# Patient Record
Sex: Male | Born: 1985
Health system: Southern US, Community
[De-identification: ages and names within clinical notes are randomized; demographics above are authoritative.]

## PROBLEM LIST (undated history)

## (undated) DIAGNOSIS — K859 Acute pancreatitis without necrosis or infection, unspecified: Secondary | ICD-10-CM

---

## 2016-01-10 ENCOUNTER — Emergency Department (HOSPITAL_BASED_OUTPATIENT_CLINIC_OR_DEPARTMENT_OTHER)
Admission: EM | Admit: 2016-01-10 | Discharge: 2016-01-10 | Disposition: A | Discharge status: Home / Self Care | Payer: Self-pay | Attending: Emergency Medicine | Admitting: Emergency Medicine

## 2016-01-10 ENCOUNTER — Encounter (HOSPITAL_BASED_OUTPATIENT_CLINIC_OR_DEPARTMENT_OTHER): Payer: Self-pay | Admitting: Emergency Medicine

## 2016-01-10 ENCOUNTER — Emergency Department (HOSPITAL_BASED_OUTPATIENT_CLINIC_OR_DEPARTMENT_OTHER): Payer: Self-pay

## 2016-01-10 DIAGNOSIS — R531 Weakness: Secondary | ICD-10-CM | POA: Insufficient documentation

## 2016-01-10 DIAGNOSIS — F17210 Nicotine dependence, cigarettes, uncomplicated: Secondary | ICD-10-CM | POA: Insufficient documentation

## 2016-01-10 DIAGNOSIS — R0602 Shortness of breath: Secondary | ICD-10-CM | POA: Insufficient documentation

## 2016-01-10 DIAGNOSIS — R0789 Other chest pain: Secondary | ICD-10-CM | POA: Insufficient documentation

## 2016-01-10 DIAGNOSIS — R05 Cough: Secondary | ICD-10-CM | POA: Insufficient documentation

## 2016-01-10 NOTE — ED Provider Notes (Signed)
[facility] Provider Note   CSN: [id number] Arrival date & time: 01/10/16  1650 By signing my name below, I, Bridgette HabermannMaria Tan, attest that this documentation has been prepared under the direction and in the presence of Melene Planan Keamber Macfadden, DO. Electronically Signed: Bridgette HabermannMaria Tan, ED Scribe. 01/10/16. 6:04 PM.  History   Chief Complaint Chief Complaint  Patient presents with  . Chest Pain   HPI Comments: Jeffrey Monroe is a 30 y.o. male who presents to the Emergency Department complaining of constant, aching, 7/10 chest pain onset one day ago. Pt also has associated generalized weakness, mild shortness of breath, cough, and dizziness. Pain is exacerbated with inspiration. No alleviating factors noted. Pt denies h/o recent long travel. Denies recent surgeries or h/o blood clots. Pt denies vomiting, hemoptysis, or any other associated symptoms.  The history is provided by the patient. No language interpreter was used.  Chest Pain   This is a new problem. The current episode started yesterday. The problem occurs constantly. The problem has not changed since onset.The pain is associated with breathing. The pain is present in the substernal region. The pain is at a severity of 7/10. The pain is moderate. The pain does not radiate. Duration of episode(s) is 2 days. The symptoms are aggravated by certain positions and deep breathing. Associated symptoms include cough, dizziness, shortness of breath and weakness. Pertinent negatives include no abdominal pain, no fever, no headaches, no nausea, no palpitations and no vomiting. He has tried nothing for the symptoms. Risk factors include smoking/tobacco exposure.   History reviewed. No pertinent past medical history.  There are no active problems to display for this patient.  History reviewed. No pertinent surgical history.   Home Medications    Prior to Admission medications   Not on File    Family History History reviewed. No pertinent family  history.  Social History Social History  Substance Use Topics  . Smoking status: Current Every Day Smoker    Packs/day: 1.00    Types: Cigarettes  . Smokeless tobacco: Never Used  . Alcohol use No     Allergies   Review of patient's allergies indicates no known allergies.   Review of Systems Review of Systems  Constitutional: Negative for chills and fever.  HENT: Negative for congestion and facial swelling.   Eyes: Negative for discharge and visual disturbance.  Respiratory: Positive for cough and shortness of breath.   Cardiovascular: Positive for chest pain. Negative for palpitations.  Gastrointestinal: Negative for abdominal pain, diarrhea, nausea and vomiting.  Musculoskeletal: Negative for arthralgias and myalgias.  Skin: Negative for color change and rash.  Neurological: Positive for dizziness and weakness. Negative for tremors, syncope and headaches.  Psychiatric/Behavioral: Negative for confusion and dysphoric mood.   Physical Exam Updated Vital Signs BP 123/87 (BP Location: Left Arm)   Pulse 65   Temp 97.9 F (36.6 C) (Oral)   Resp 16   Ht 5\' 10"  (1.778 m)   Wt 145 lb (65.8 kg)   SpO2 100%   BMI 20.81 kg/m   Physical Exam  Constitutional: He is oriented to person, place, and time. He appears well-developed and well-nourished.  HENT:  Head: Normocephalic and atraumatic.  Eyes: EOM are normal. Pupils are equal, round, and reactive to light.  Neck: Normal range of motion. Neck supple. No JVD present.  Cardiovascular: Normal rate and regular rhythm.  Exam reveals no gallop and no friction rub.   No murmur heard. Pulmonary/Chest: No respiratory distress. He has no wheezes. He  exhibits tenderness.  Tender to palpation along anterior chest wall.   Abdominal: Soft. He exhibits no distension. There is tenderness. There is no rebound and no guarding.  Mild epigastric tenderness.  Musculoskeletal: Normal range of motion. He exhibits no edema.  No lower extremity  edema.  Neurological: He is alert and oriented to person, place, and time.  Skin: No rash noted. No pallor.  Psychiatric: He has a normal mood and affect. His behavior is normal.  Nursing note and vitals reviewed.  ED Treatments / Results  DIAGNOSTIC STUDIES: Oxygen Saturation is 100% on RA, normal by my interpretation.    COORDINATION OF CARE: 6:04 PM Discussed treatment plan with pt at bedside and pt agreed to plan.  Labs (all labs ordered are listed, but only abnormal results are displayed) Labs Reviewed - No data to display  EKG  EKG Interpretation  Date/Time:  Sunday January 10 2016 18:43:09 EDT Ventricular Rate:  71 PR Interval:    QRS Duration: 91 QT Interval:  394 QTC Calculation: 429 R Axis:   75 Text Interpretation:  Sinus arrhythmia ST elev, probable normal early repol pattern No old tracing to compare Confirmed by Milika Ventress MD, DANIEL [phone]) on 01/10/2016 6:45:52 PM       Radiology Dg Chest 2 View  Result Date: 01/10/2016 CLINICAL DATA:  Mid chest pain yesterday with intermittent shortness of breath. EXAM: CHEST  2 VIEW COMPARISON:  None. FINDINGS: Lungs are clear. Cardiomediastinal silhouette, bones and soft tissues are within normal. IMPRESSION: No active cardiopulmonary disease. Electronically Signed   By: Elberta Fortis M.D.   On: 01/10/2016 17:17    Procedures Procedures (including critical care time)  Medications Ordered in ED Medications - No data to display   Initial Impression / Assessment and Plan / ED Course  I have reviewed the triage vital signs and the nursing notes.  Pertinent labs & imaging results that were available during my care of the patient were reviewed by me and considered in my medical decision making (see chart for details).  Clinical Course    30 yo M with atypical chest pain.  Reproducible with palpation of the chest wall, hx of consistent with muscle strain.  ECG and cxr unremarkable.  D/c home.   6:48 PM:  I have discussed  the diagnosis/risks/treatment options with the patient and family and believe the pt to be eligible for discharge home to follow-up with PCP. We also discussed returning to the ED immediately if new or worsening sx occur. We discussed the sx which are most concerning (e.g., sudden worsening pain, fever, inability to tolerate by mouth) that necessitate immediate return. Medications administered to the patient during their visit and any new prescriptions provided to the patient are listed below.  Medications given during this visit Medications - No data to display   The patient appears reasonably screen and/or stabilized for discharge and I doubt any other medical condition or other [facility] requiring further screening, evaluation, or treatment in the ED at this time prior to discharge.    Final Clinical Impressions(s) / ED Diagnoses   Final diagnoses:  Chest wall pain    New Prescriptions New Prescriptions   No medications on file   I personally performed the services described in this documentation, which was scribed in my presence. The recorded information has been reviewed and is accurate.     Melene Plan, DO 01/10/16 [phone]

## 2016-01-10 NOTE — ED Triage Notes (Addendum)
Patient reports chest pain and weakness on movement.  States this began Saturday during the day.  Reports shortness of breath, nausea.  Denies vomiting.  Patient ambulatory to triage in NAD.

## 2016-01-10 NOTE — Discharge Instructions (Signed)
Take 4 over the counter ibuprofen tablets 3 times a day or 2 over-the-counter naproxen tablets twice a day for pain. Also take tylenol 1000mg(2 extra strength) four times a day.    

## 2016-01-10 NOTE — ED Notes (Signed)
Pt given d/c instructions as per chart. Verbalizes understanding. No questions. 

## 2017-03-31 ENCOUNTER — Encounter (HOSPITAL_BASED_OUTPATIENT_CLINIC_OR_DEPARTMENT_OTHER): Payer: Self-pay | Admitting: *Deleted

## 2017-03-31 ENCOUNTER — Emergency Department (HOSPITAL_BASED_OUTPATIENT_CLINIC_OR_DEPARTMENT_OTHER)
Admission: EM | Admit: 2017-03-31 | Discharge: 2017-03-31 | Disposition: A | Discharge status: Home / Self Care | Payer: Self-pay | Attending: Emergency Medicine | Admitting: Emergency Medicine

## 2017-03-31 ENCOUNTER — Emergency Department (HOSPITAL_BASED_OUTPATIENT_CLINIC_OR_DEPARTMENT_OTHER): Payer: Self-pay

## 2017-03-31 DIAGNOSIS — F17210 Nicotine dependence, cigarettes, uncomplicated: Secondary | ICD-10-CM | POA: Insufficient documentation

## 2017-03-31 DIAGNOSIS — R072 Precordial pain: Secondary | ICD-10-CM | POA: Insufficient documentation

## 2017-03-31 DIAGNOSIS — R1013 Epigastric pain: Secondary | ICD-10-CM | POA: Insufficient documentation

## 2017-03-31 LAB — COMPREHENSIVE METABOLIC PANEL
ALK PHOS: 72 U/L (ref 38–126)
ALT: 11 U/L — AB (ref 17–63)
AST: 20 U/L (ref 15–41)
Albumin: 4.2 g/dL (ref 3.5–5.0)
Anion gap: 5 (ref 5–15)
BUN: 9 mg/dL (ref 6–20)
CALCIUM: 8.7 mg/dL — AB (ref 8.9–10.3)
CO2: 28 mmol/L (ref 22–32)
CREATININE: 0.9 mg/dL (ref 0.61–1.24)
Chloride: 104 mmol/L (ref 101–111)
Glucose, Bld: 87 mg/dL (ref 65–99)
Potassium: 3.6 mmol/L (ref 3.5–5.1)
Sodium: 137 mmol/L (ref 135–145)
Total Bilirubin: 0.5 mg/dL (ref 0.3–1.2)
Total Protein: 7.1 g/dL (ref 6.5–8.1)

## 2017-03-31 LAB — CBC WITH DIFFERENTIAL/PLATELET
BASOS ABS: 0 10*3/uL (ref 0.0–0.1)
BASOS PCT: 1 %
EOS ABS: 0.5 10*3/uL (ref 0.0–0.7)
Eosinophils Relative: 8 %
HCT: 41.2 % (ref 39.0–52.0)
HEMOGLOBIN: 14.1 g/dL (ref 13.0–17.0)
Lymphocytes Relative: 50 %
Lymphs Abs: 3.5 10*3/uL (ref 0.7–4.0)
MCH: 31.3 pg (ref 26.0–34.0)
MCHC: 34.2 g/dL (ref 30.0–36.0)
MCV: 91.4 fL (ref 78.0–100.0)
Monocytes Absolute: 0.5 10*3/uL (ref 0.1–1.0)
Monocytes Relative: 8 %
NEUTROS PCT: 33 %
Neutro Abs: 2.3 10*3/uL (ref 1.7–7.7)
Platelets: 207 10*3/uL (ref 150–400)
RBC: 4.51 MIL/uL (ref 4.22–5.81)
RDW: 11.7 % (ref 11.5–15.5)
WBC: 6.8 10*3/uL (ref 4.0–10.5)

## 2017-03-31 LAB — TROPONIN I

## 2017-03-31 MED ORDER — PANTOPRAZOLE SODIUM 40 MG PO TBEC: 40.0000 mg | DELAYED_RELEASE_TABLET | Freq: Every day | ORAL | 0 refills | Status: DC

## 2017-03-31 MED ORDER — GI COCKTAIL ~~LOC~~
30.0000 mL | Freq: Once | ORAL | Status: AC
  Administered 2017-03-31: 30 mL via ORAL
  Filled 2017-03-31: qty 30

## 2017-03-31 MED ORDER — SUCRALFATE 1 G PO TABS: 1.0000 g | ORAL_TABLET | Freq: Three times a day (TID) | ORAL | 0 refills | Status: DC

## 2017-03-31 NOTE — Discharge Instructions (Signed)

## 2017-03-31 NOTE — ED Provider Notes (Signed)
Emergency Department Provider Note   I have reviewed the triage vital signs and the nursing notes.   HISTORY  Chief Complaint Chest Pain   HPI Jeffrey Monroe is a 31 y.o. male presents to the emergency department for evaluation of substernal and epigastric pain.  Patient states the pain in his chest feels like a tightness while the pain in the upper abdomen feels like a burning.  No vomiting but he has had some nausea.  He states this feels similar to acid reflux he has had in the past but did not take any medication prior to ED evaluation.  States that the chest pain is new and is what concerned him the most.  No productive cough or fevers.  No hemoptysis.  No radiation of symptoms.    History reviewed. No pertinent past medical history.  There are no active problems to display for this patient.   History reviewed. No pertinent surgical history.    Allergies Patient has no known allergies.  History reviewed. No pertinent family history.  Social History Social History   Tobacco Use  . Smoking status: Current Every Day Smoker    Packs/day: 1.00    Types: Cigarettes  . Smokeless tobacco: Never Used  Substance Use Topics  . Alcohol use: No  . Drug use: No    Review of Systems  Constitutional: No fever/chills Eyes: No visual changes. ENT: No sore throat. Cardiovascular: Positive chest pain. Respiratory: Denies shortness of breath. Gastrointestinal: Positive epigastric abdominal pain.  Positive nausea, no vomiting.  No diarrhea.  No constipation. Genitourinary: Negative for dysuria. Musculoskeletal: Negative for back pain. Skin: Negative for rash. Neurological: Negative for headaches, focal weakness or numbness.  10-point ROS otherwise negative.  ____________________________________________   PHYSICAL EXAM:  VITAL SIGNS: ED Triage Vitals  Enc Vitals Group     BP 03/31/17 2037 122/74     Pulse Rate 03/31/17 2037 72     Resp 03/31/17 2037 15     Temp  03/31/17 2037 98.2 F (36.8 C)     Temp Source 03/31/17 2037 Oral     SpO2 03/31/17 2037 100 %     Weight 03/31/17 2037 145 lb (65.8 kg)     Height 03/31/17 2037 5\' 11"  (1.803 m)     Pain Score 03/31/17 2040 8   Constitutional: Alert and oriented. Well appearing and in no acute distress. Eyes: Conjunctivae are normal. Head: Atraumatic. Nose: No congestion/rhinnorhea. Mouth/Throat: Mucous membranes are moist.   Neck: No stridor. Cardiovascular: Normal rate, regular rhythm. Good peripheral circulation. Grossly normal heart sounds.   Respiratory: Normal respiratory effort.  No retractions. Lungs CTAB. Gastrointestinal: Soft with mild epigastric tenderness to palpation. No rebound, guarding, or Murphy's sign. No distention.  Musculoskeletal: No lower extremity tenderness nor edema. No gross deformities of extremities. Neurologic:  Normal speech and language. No gross focal neurologic deficits are appreciated.  Skin:  Skin is warm, dry and intact. No rash noted.  ____________________________________________   LABS (all labs ordered are listed, but only abnormal results are displayed)  Labs Reviewed  COMPREHENSIVE METABOLIC PANEL - Abnormal; Notable for the following components:      Result Value   Calcium 8.7 (*)    ALT 11 (*)    All other components within normal limits  CBC WITH DIFFERENTIAL/PLATELET  TROPONIN I   ____________________________________________  EKG   EKG Interpretation  Date/Time:  Friday March 31 2017 20:38:44 EST Ventricular Rate:  66 PR Interval:  142 QRS Duration: 88 QT  Interval:  380 QTC Calculation: 398 R Axis:   86 Text Interpretation:  Normal sinus rhythm Minimal voltage criteria for LVH, may be normal variant Septal infarct , age undetermined Abnormal ECG No STEMI. Similar to prior.  Confirmed by Alona BeneLong, Iliza Blankenbeckler [phone]) on 03/31/2017 8:49:32 PM       ____________________________________________  RADIOLOGY  Dg Chest 2 View  Result Date:  03/31/2017 CLINICAL DATA:  Chest pain and nausea EXAM: CHEST  2 VIEW COMPARISON:  November 04, 2016 FINDINGS: There is no edema or consolidation. The heart size and pulmonary vascularity are normal. No adenopathy. No pneumothorax. No bone lesions. IMPRESSION: No edema or consolidation. Electronically Signed   By: Bretta BangWilliam  Woodruff III M.D.   On: 03/31/2017 21:59    ____________________________________________   PROCEDURES  Procedure(s) performed:   Procedures  None ____________________________________________   INITIAL IMPRESSION / ASSESSMENT AND PLAN / ED COURSE  Pertinent labs & imaging results that were available during my care of the patient were reviewed by me and considered in my medical decision making (see chart for details).  Patient presents to the emergency department for evaluation of burning epigastric discomfort with chest tightness.  EKG seems similar to prior and shows evidence of LVH.  No evidence of acute ischemia.  Patient is young with few risk factors for ACS.  Very low suspicion for PE.   Differential includes all life-threatening causes for chest pain. This includes but is not exclusive to acute coronary syndrome, aortic dissection, pulmonary embolism, cardiac tamponade, community-acquired pneumonia, pericarditis, musculoskeletal chest wall pain, etc.  Patient feeling much improved after GI cocktail. Labs and imaging are unremarkable. Plan to start Protonix and Carafate at home. [facility] additional outpatient follow up care.   At this time, I do not feel there is any life-threatening condition present. I have reviewed and discussed all results (EKG, imaging, lab, urine as appropriate), exam findings with patient. I have reviewed nursing notes and appropriate previous records.  I feel the patient is safe to be discharged home without further emergent workup. Discussed usual and customary return precautions. Patient and family (if  present) verbalize understanding and are comfortable with this plan.  Patient will follow-up with their primary care provider. If they do not have a primary care provider, information for follow-up has been provided to them. All questions have been answered.   ____________________________________________  FINAL CLINICAL IMPRESSION(S) / ED DIAGNOSES  Final diagnoses:  Precordial chest pain  Epigastric abdominal pain     MEDICATIONS GIVEN DURING THIS VISIT:  Medications  gi cocktail (Maalox,Lidocaine,Donnatal) (30 mLs Oral Given 03/31/17 2116)     NEW OUTPATIENT MEDICATIONS STARTED DURING THIS VISIT:  Protonix and Carafate   Note:  This document was prepared using Dragon voice recognition software and may include unintentional dictation errors.  Alona BeneJoshua Jousha Schwandt, MD Emergency Medicine    Lafayette Dunlevy, Arlyss RepressJoshua G, MD 03/31/17 [phone]

## 2017-03-31 NOTE — ED Triage Notes (Signed)
subternal and epigastric CP x several days. Denies SOB. Reports nausea, denies vomiting. Reports that he thinks 'hes got acid'

## 2017-06-07 ENCOUNTER — Encounter (HOSPITAL_BASED_OUTPATIENT_CLINIC_OR_DEPARTMENT_OTHER): Payer: Self-pay | Admitting: Emergency Medicine

## 2017-06-07 ENCOUNTER — Other Ambulatory Visit: Payer: Self-pay

## 2017-06-07 ENCOUNTER — Emergency Department (HOSPITAL_BASED_OUTPATIENT_CLINIC_OR_DEPARTMENT_OTHER)
Admission: EM | Admit: 2017-06-07 | Discharge: 2017-06-07 | Disposition: A | Discharge status: Home / Self Care | Payer: Self-pay | Attending: Emergency Medicine | Admitting: Emergency Medicine

## 2017-06-07 DIAGNOSIS — K0889 Other specified disorders of teeth and supporting structures: Secondary | ICD-10-CM

## 2017-06-07 DIAGNOSIS — F17210 Nicotine dependence, cigarettes, uncomplicated: Secondary | ICD-10-CM | POA: Insufficient documentation

## 2017-06-07 DIAGNOSIS — Z79899 Other long term (current) drug therapy: Secondary | ICD-10-CM | POA: Insufficient documentation

## 2017-06-07 MED ORDER — NAPROXEN 500 MG PO TABS: 500.0000 mg | ORAL_TABLET | Freq: Two times a day (BID) | ORAL | 0 refills | Status: DC

## 2017-06-07 MED ORDER — PENICILLIN V POTASSIUM 250 MG PO TABS
500.0000 mg | ORAL_TABLET | Freq: Once | ORAL | Status: AC
  Administered 2017-06-07: 500 mg via ORAL
  Filled 2017-06-07: qty 2

## 2017-06-07 MED ORDER — LIDOCAINE-EPINEPHRINE (PF) 2 %-1:200000 IJ SOLN
10.0000 mL | Freq: Once | INTRAMUSCULAR | Status: AC
  Administered 2017-06-07: 10 mL
  Filled 2017-06-07: qty 10

## 2017-06-07 MED ORDER — BUPIVACAINE-EPINEPHRINE (PF) 0.5% -1:200000 IJ SOLN
3.6000 mL | Freq: Once | INTRAMUSCULAR | Status: AC
  Administered 2017-06-07: 3.6 mL
  Filled 2017-06-07: qty 3.6

## 2017-06-07 MED ORDER — PENICILLIN V POTASSIUM 500 MG PO TABS: 500.0000 mg | ORAL_TABLET | Freq: Three times a day (TID) | ORAL | 0 refills | Status: DC

## 2017-06-07 MED FILL — PENICILLIN VK 500 MG TABLET: 500 | 7 days supply | Qty: 21 | Fill #0

## 2017-06-07 MED FILL — NAPROXEN 500 MG TABLET: 500 | 10 days supply | Qty: 20 | Fill #0

## 2017-06-07 NOTE — ED Provider Notes (Signed)
[facility] EMERGENCY DEPARTMENT Provider Note   CSN: [id number] Arrival date & time: 06/07/17  0913     History   Chief Complaint Chief Complaint  Patient presents with  . Dental Pain    HPI Jeffrey Monroe is a 32 y.o. male.  Patient presents the emergency department with complaint of dental pain ongoing for the past 2 days.  Pain radiates to his right ear.  No facial swelling, difficulty breathing or difficulty swallowing.  Patient has had symptoms like this in the past.  No treatments prior to arrival other than Tylenol and ibuprofen which have not been helping his pain.  Patient requests dental block.  Onset of symptoms acute.  Course is constant.  Nothing makes symptoms better or worse.      History reviewed. No pertinent past medical history.  There are no active problems to display for this patient.   History reviewed. No pertinent surgical history.     Home Medications    Prior to Admission medications   Medication Sig Start Date End Date Taking? Authorizing Provider  naproxen (NAPROSYN) 500 MG tablet Take 1 tablet (500 mg total) by mouth 2 (two) times daily. 06/07/17   Renne CriglerGeiple, Blakleigh Straw, PA-C  pantoprazole (PROTONIX) 40 MG tablet Take 1 tablet (40 mg total) by mouth daily. 03/31/17 04/30/17  Long, Arlyss RepressJoshua G, MD  penicillin v potassium (VEETID) 500 MG tablet Take 1 tablet (500 mg total) by mouth 3 (three) times daily. 06/07/17   Renne CriglerGeiple, Lennis Rader, PA-C  sucralfate (CARAFATE) 1 g tablet Take 1 tablet (1 g total) by mouth 4 (four) times daily -  with meals and at bedtime. 03/31/17 04/30/17  Long, Arlyss RepressJoshua G, MD    Family History No family history on file.  Social History Social History   Tobacco Use  . Smoking status: Current Every Day Smoker    Packs/day: 1.00    Types: Cigarettes  . Smokeless tobacco: Never Used  Substance Use Topics  . Alcohol use: No  . Drug use: No     Allergies   Patient has no known allergies.   Review of Systems Review of Systems   Constitutional: Negative for fever.  HENT: Positive for dental problem and ear pain. Negative for facial swelling, sore throat and trouble swallowing.   Respiratory: Negative for shortness of breath and stridor.   Musculoskeletal: Negative for neck pain.  Skin: Negative for color change.  Neurological: Negative for headaches.     Physical Exam Updated Vital Signs BP (!) 126/93 (BP Location: Right Arm)   Pulse (!) 58   Temp 99 F (37.2 C) (Oral)   Resp 18   Ht 5\' 11"  (1.803 m)   Wt 65.8 kg (145 lb)   SpO2 100%   BMI 20.22 kg/m   Physical Exam  Constitutional: He appears well-developed and well-nourished.  HENT:  Head: Normocephalic and atraumatic.  Right Ear: Tympanic membrane, external ear and ear canal normal.  Left Ear: Tympanic membrane, external ear and ear canal normal.  Nose: Nose normal.  Mouth/Throat: Uvula is midline, oropharynx is clear and moist and mucous membranes are normal. No trismus in the jaw. Abnormal dentition. Dental caries present. No dental abscesses or uvula swelling. No tonsillar abscesses.  Erythema of the right upper gumline.  Patient reports pain in the area of teeth #1 and 2.  Patient also reports pain and tenderness in the area of tooth #30.  No gross swelling or abscess noted.  Eyes: Pupils are equal, round, and reactive to  light.  Neck: Normal range of motion. Neck supple.  No neck swelling or Lugwig's angina  Neurological: He is alert.  Skin: Skin is warm and dry.  Psychiatric: He has a normal mood and affect.  Nursing note and vitals reviewed.    ED Treatments / Results  Labs (all labs ordered are listed, but only abnormal results are displayed) Labs Reviewed - No data to display  EKG  EKG Interpretation None       Radiology No results found.  Procedures Dental Block Date/Time: 06/07/2017 10:55 AM Performed by: Renne Crigler, PA-C Authorized by: Renne Crigler, PA-C   Consent:    Consent obtained:  Verbal   Consent  given by:  Patient   Risks discussed:  Hematoma, pain and unsuccessful block   Alternatives discussed:  No treatment Indications:    Indications: dental pain   Location:    Block type:  Supraperiosteal   Supraperiosteal location:  Upper teeth   Upper teeth location:  2/RU 2nd molar Procedure details (see MAR for exact dosages):    Topical anesthetic:  Lidocaine gel   Syringe type:  Luer lock syringe   Needle gauge:  27 G   Anesthetic injected:  Bupivacaine 0.5% WITH epi and lidocaine 2% WITH epi   Injection procedure:  Anatomic landmarks identified, introduced needle, incremental injection, negative aspiration for blood and anatomic landmarks palpated Post-procedure details:    Outcome:  Pain improved   Patient tolerance of procedure:  Tolerated well, no immediate complications Dental Block Date/Time: 06/07/2017 10:56 AM Performed by: Renne Crigler, PA-C Authorized by: Renne Crigler, PA-C   Consent:    Consent obtained:  Verbal   Consent given by:  Patient   Risks discussed:  Hematoma, pain and unsuccessful block   Alternatives discussed:  No treatment Indications:    Indications: dental pain   Location:    Block type:  Supraperiosteal   Supraperiosteal location:  Lower teeth   Lower teeth location:  30/RL 1st molar, 31/RL 2nd molar and 32/RL 3rd molar Procedure details (see MAR for exact dosages):    Syringe type:  Luer lock syringe   Needle gauge:  27 G   Anesthetic injected:  Lidocaine 2% WITH epi and bupivacaine 0.5% WITH epi   Injection procedure:  Anatomic landmarks identified, introduced needle, incremental injection, negative aspiration for blood and anatomic landmarks palpated Post-procedure details:    Outcome:  Pain improved   Patient tolerance of procedure:  Tolerated well, no immediate complications   (including critical care time)  Medications Ordered in ED Medications  penicillin v potassium (VEETID) tablet 500 mg (500 mg Oral Given 06/07/17 0946)    bupivacaine-epinephrine (MARCAINE W/ EPI) 0.5% -1:200000 injection 3.6 mL (3.6 mLs Infiltration Given by Other 06/07/17 0948)  lidocaine-EPINEPHrine (XYLOCAINE W/EPI) 2 %-1:200000 (PF) injection 10 mL (10 mLs Infiltration Given by Other 06/07/17 0947)     Initial Impression / Assessment and Plan / ED Course  I have reviewed the triage vital signs and the nursing notes.  Pertinent labs & imaging results that were available during my care of the patient were reviewed by me and considered in my medical decision making (see chart for details).     10:57 AM Patient seen and examined. Medications ordered.   Vital signs reviewed and are as follows: BP (!) 126/93 (BP Location: Right Arm)   Pulse (!) 58   Temp 99 F (37.2 C) (Oral)   Resp 18   Ht 5\' 11"  (1.803 m)   Wt 65.8  kg (145 lb)   SpO2 100%   BMI 20.22 kg/m   Patient counseled to take prescribed medications as directed, return with worsening facial or neck swelling, and to follow-up with their dentist as soon as possible.    Final Clinical Impressions(s) / ED Diagnoses   Final diagnoses:  Toothache   Patient with toothache. No fever. Dental block performed. Home with rx penicillin and naproxen. No signs of ludwigs.   ED Discharge Orders        Ordered    penicillin v potassium (VEETID) 500 MG tablet  3 times daily     06/07/17 1050    naproxen (NAPROSYN) 500 MG tablet  2 times daily     06/07/17 1050       Renne Crigler, PA-C 06/07/17 1057    Cardama, Amadeo Garnet, MD 06/07/17 1324

## 2017-06-07 NOTE — Discharge Instructions (Signed)
Please read and follow all provided instructions.  Your diagnoses today include:  1. Toothache    The exam and treatment you received today has been provided on an emergency basis only. This is not a substitute for complete medical or dental care.  Tests performed today include:  Vital signs. See below for your results today.   Medications prescribed:   Penicillin - antibiotic  You have been prescribed an antibiotic medicine: take the entire course of medicine even if you are feeling better. Stopping early can cause the antibiotic not to work.   Naproxen - anti-inflammatory pain medication  Do not exceed 500mg  naproxen every 12 hours, take with food  You have been prescribed an anti-inflammatory medication or NSAID. Take with food. Take smallest effective dose for the shortest duration needed for your pain. Stop taking if you experience stomach pain or vomiting.   Take any prescribed medications only as directed.  Home care instructions:  Follow any educational materials contained in this packet.  Follow-up instructions: Please follow-up with your dentist for further evaluation of your symptoms.   Dental Assistance: See attached dental referrals  Return instructions:   Please return to the Emergency Department if you experience worsening symptoms.  Please return if you develop a fever, you develop more swelling in your face or neck, you have trouble breathing or swallowing food.  Please return if you have any other emergent concerns.  Additional Information:  Your vital signs today were: BP (!) 126/93 (BP Location: Right Arm)    Pulse (!) 58    Temp 99 F (37.2 C) (Oral)    Resp 18    Ht 5\' 11"  (1.803 m)    Wt 65.8 kg (145 lb)    SpO2 100%    BMI 20.22 kg/m  If your blood pressure (BP) was elevated above 135/85 this visit, please have this repeated by your doctor within one month. --------------

## 2017-06-07 NOTE — ED Triage Notes (Signed)
Dental pain x 2 days

## 2017-09-22 IMAGING — DX DG CHEST 2V
2 series · 2 of 2 positions shown · non-contrast
Comparison: None.

CLINICAL DATA: Mid chest pain yesterday with intermittent shortness
of breath.

EXAM:
CHEST  2 VIEW

[chest pa]
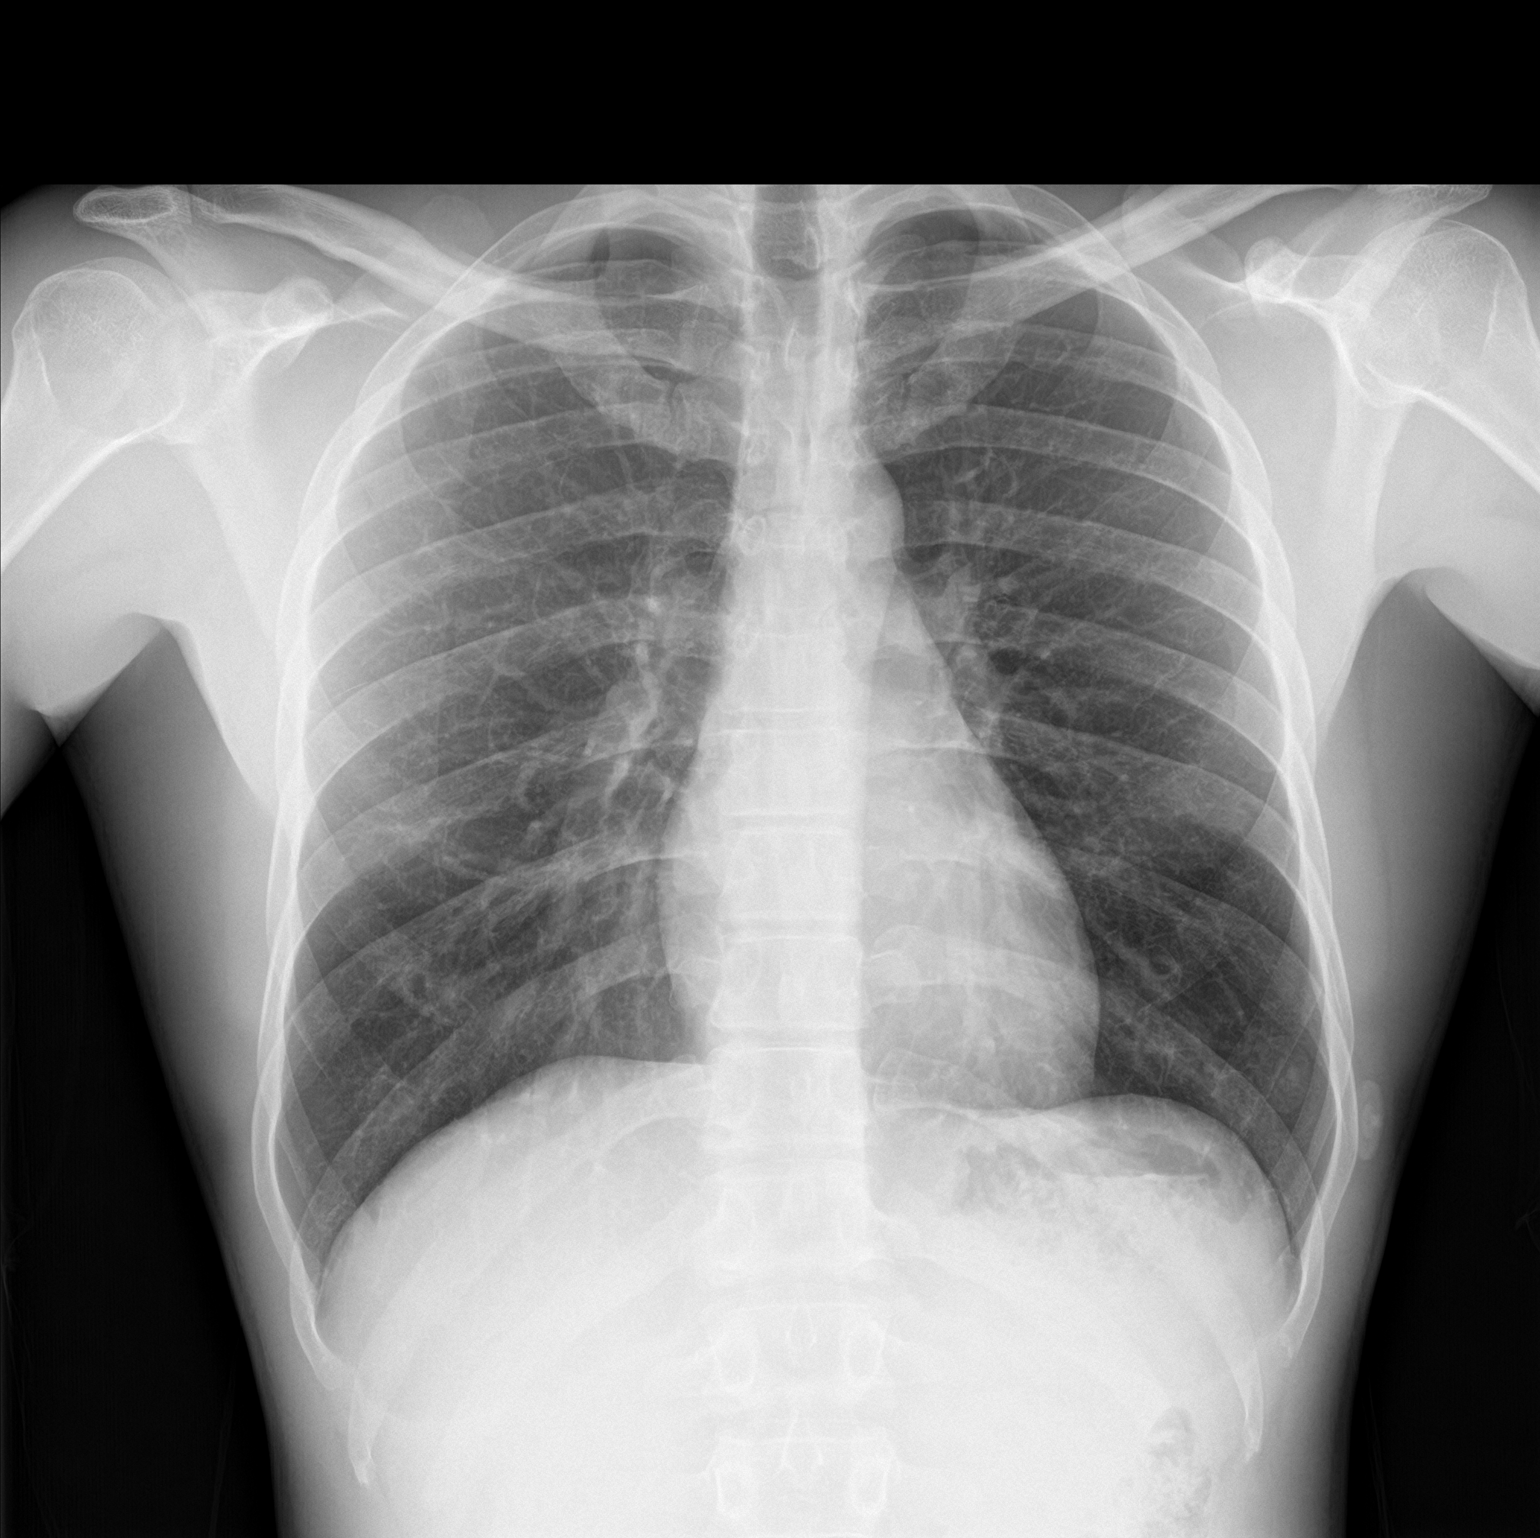

[chest lat]
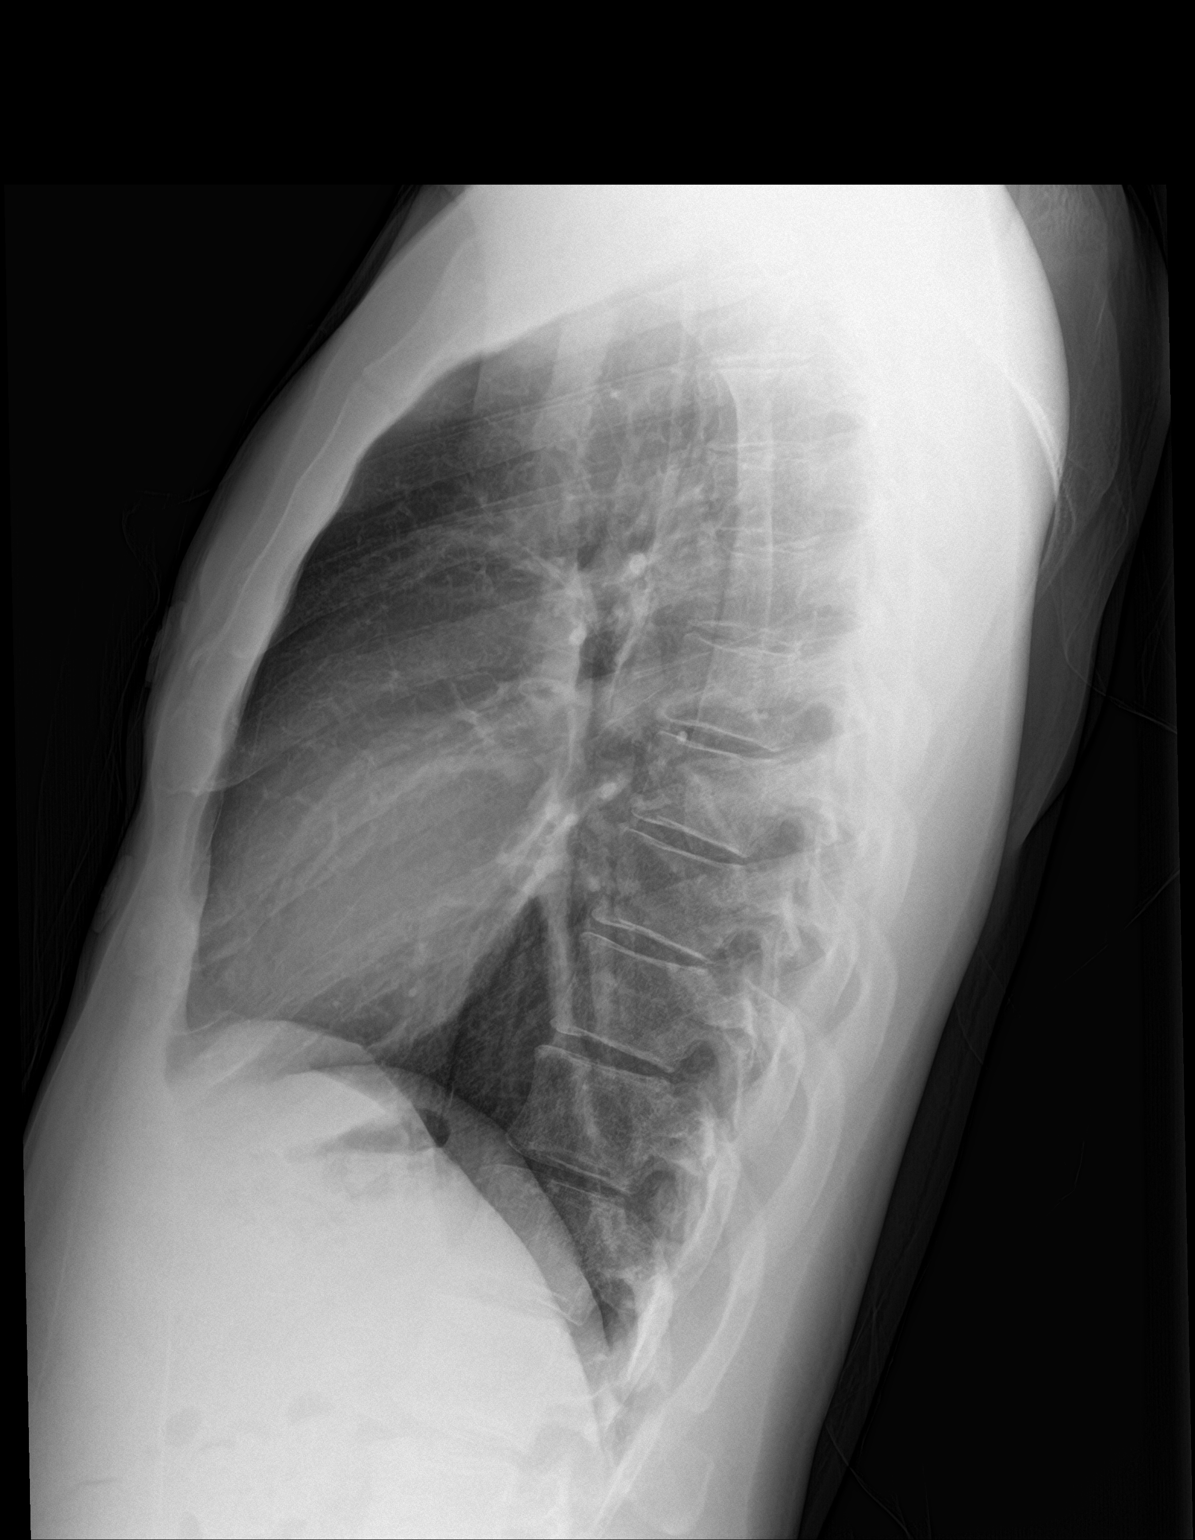

[2 of 2 positions shown; findings below may reference images not displayed]

FINDINGS: Lungs are clear. Cardiomediastinal silhouette, bones and soft
tissues are within normal.
IMPRESSION: No active cardiopulmonary disease.

## 2017-10-13 ENCOUNTER — Encounter (HOSPITAL_BASED_OUTPATIENT_CLINIC_OR_DEPARTMENT_OTHER): Payer: Self-pay

## 2017-10-13 ENCOUNTER — Emergency Department (HOSPITAL_BASED_OUTPATIENT_CLINIC_OR_DEPARTMENT_OTHER)
Admission: EM | Admit: 2017-10-13 | Discharge: 2017-10-14 | Disposition: A | Discharge status: Home / Self Care | Payer: Self-pay | Attending: Emergency Medicine | Admitting: Emergency Medicine

## 2017-10-13 ENCOUNTER — Other Ambulatory Visit: Payer: Self-pay

## 2017-10-13 DIAGNOSIS — R112 Nausea with vomiting, unspecified: Secondary | ICD-10-CM | POA: Insufficient documentation

## 2017-10-13 DIAGNOSIS — F17210 Nicotine dependence, cigarettes, uncomplicated: Secondary | ICD-10-CM | POA: Insufficient documentation

## 2017-10-13 DIAGNOSIS — R1013 Epigastric pain: Secondary | ICD-10-CM | POA: Insufficient documentation

## 2017-10-13 DIAGNOSIS — R630 Anorexia: Secondary | ICD-10-CM | POA: Insufficient documentation

## 2017-10-13 DIAGNOSIS — R1032 Left lower quadrant pain: Secondary | ICD-10-CM | POA: Insufficient documentation

## 2017-10-13 DIAGNOSIS — Z79899 Other long term (current) drug therapy: Secondary | ICD-10-CM | POA: Insufficient documentation

## 2017-10-13 LAB — COMPREHENSIVE METABOLIC PANEL
ALK PHOS: 68 U/L (ref 38–126)
ALT: 16 U/L — ABNORMAL LOW (ref 17–63)
ANION GAP: 12 (ref 5–15)
AST: 25 U/L (ref 15–41)
Albumin: 4.4 g/dL (ref 3.5–5.0)
BILIRUBIN TOTAL: 1.1 mg/dL (ref 0.3–1.2)
BUN: 9 mg/dL (ref 6–20)
CALCIUM: 8.7 mg/dL — AB (ref 8.9–10.3)
CO2: 23 mmol/L (ref 22–32)
Chloride: 103 mmol/L (ref 101–111)
Creatinine, Ser: 0.97 mg/dL (ref 0.61–1.24)
GFR calc Af Amer: >60 mL/min (ref >60)
GFR calc non Af Amer: >60 mL/min (ref >60)
Glucose, Bld: 86 mg/dL (ref 65–99)
Potassium: 3.8 mmol/L (ref 3.5–5.1)
SODIUM: 138 mmol/L (ref 135–145)
TOTAL PROTEIN: 7.4 g/dL (ref 6.5–8.1)

## 2017-10-13 LAB — CBC WITH DIFFERENTIAL/PLATELET
Basophils Absolute: 0 10*3/uL (ref 0.0–0.1)
Basophils Relative: 0 %
EOS ABS: 0.2 10*3/uL (ref 0.0–0.7)
Eosinophils Relative: 2 %
HCT: 40.1 % (ref 39.0–52.0)
HEMOGLOBIN: 13.8 g/dL (ref 13.0–17.0)
Lymphocytes Relative: 35 %
Lymphs Abs: 2.6 10*3/uL (ref 0.7–4.0)
MCH: 32.4 pg (ref 26.0–34.0)
MCHC: 34.4 g/dL (ref 30.0–36.0)
MCV: 94.1 fL (ref 78.0–100.0)
MONOS PCT: 7 %
Monocytes Absolute: 0.6 10*3/uL (ref 0.1–1.0)
NEUTROS PCT: 56 %
Neutro Abs: 4.1 10*3/uL (ref 1.7–7.7)
Platelets: 168 10*3/uL (ref 150–400)
RBC: 4.26 MIL/uL (ref 4.22–5.81)
RDW: 12.4 % (ref 11.5–15.5)
WBC: 7.4 10*3/uL (ref 4.0–10.5)

## 2017-10-13 LAB — LIPASE, BLOOD: LIPASE: 28 U/L (ref 11–51)

## 2017-10-13 MED ORDER — ONDANSETRON HCL 4 MG/2ML IJ SOLN
4.00 | INTRAMUSCULAR | Status: DC
Start: ? — End: 2017-10-13

## 2017-10-13 MED ORDER — SODIUM CHLORIDE 0.9 % IV BOLUS
1000.0000 mL | Freq: Once | INTRAVENOUS | Status: AC
  Administered 2017-10-13: 1000 mL via INTRAVENOUS

## 2017-10-13 MED ORDER — FENTANYL CITRATE (PF) 2500 MCG/50ML IJ SOLN
100.00 | INTRAMUSCULAR | Status: DC
Start: ? — End: 2017-10-13

## 2017-10-13 MED ORDER — ONDANSETRON HCL 4 MG/2ML IJ SOLN
4.0000 mg | Freq: Once | INTRAMUSCULAR | Status: AC
  Administered 2017-10-14: 4 mg via INTRAVENOUS
  Filled 2017-10-13: qty 2

## 2017-10-13 MED ORDER — MORPHINE SULFATE (PF) 4 MG/ML IV SOLN
4.0000 mg | Freq: Once | INTRAVENOUS | Status: AC
  Administered 2017-10-13: 4 mg via INTRAVENOUS
  Filled 2017-10-13: qty 1

## 2017-10-13 NOTE — ED Triage Notes (Signed)
C/o abd pain, n/v x 5 days-[facility] "something about my pancreas-they gave me some medicine but it ain't helping"-NAD-steady gait

## 2017-10-13 NOTE — ED Provider Notes (Signed)
[facility] EMERGENCY DEPARTMENT Provider Note   CSN: [id number] Arrival date & time: 10/13/17  2131     History   Chief Complaint Chief Complaint  Patient presents with  . Abdominal Pain    HPI Jeffrey Monroe is a 32 y.o. male.  The history is provided by the patient.  Abdominal Pain   This is a new problem. Episode onset: 1 week. The problem occurs constantly. The problem has not changed since onset.Associated with: thinks it started after a heavy night of drinking. The pain is located in the LUQ and epigastric region. The quality of the pain is colicky, shooting and throbbing. The pain is at a severity of 7/10. The pain is moderate. Associated symptoms include anorexia, nausea and vomiting. Pertinent negatives include fever, diarrhea, dysuria and frequency. The symptoms are aggravated by eating. Nothing relieves the symptoms. Past workup comments: [facility] and had an elevated lipase at 307 with neg CT of abd/pelvis and normal LFT's.  d/ced home with prilosec and zofran.    History reviewed. No pertinent past medical history.  There are no active problems to display for this patient.   History reviewed. No pertinent surgical history.      Home Medications    Prior to Admission medications   Medication Sig Start Date End Date Taking? Authorizing Provider  naproxen (NAPROSYN) 500 MG tablet Take 1 tablet (500 mg total) by mouth 2 (two) times daily. 06/07/17   Renne CriglerGeiple, Joshua, PA-C  pantoprazole (PROTONIX) 40 MG tablet Take 1 tablet (40 mg total) by mouth daily. 03/31/17 04/30/17  Long, Arlyss RepressJoshua G, MD  penicillin v potassium (VEETID) 500 MG tablet Take 1 tablet (500 mg total) by mouth 3 (three) times daily. 06/07/17   Renne CriglerGeiple, Joshua, PA-C  sucralfate (CARAFATE) 1 g tablet Take 1 tablet (1 g total) by mouth 4 (four) times daily -  with meals and at bedtime. 03/31/17 04/30/17  Long, Arlyss RepressJoshua G, MD    Family History No family history on file.  Social History Social  History   Tobacco Use  . Smoking status: Current Every Day Smoker    Packs/day: 1.00    Types: Cigarettes  . Smokeless tobacco: Never Used  Substance Use Topics  . Alcohol use: Yes    Comment: occ  . Drug use: No     Allergies   Patient has no known allergies.   Review of Systems Review of Systems  Constitutional: Negative for fever.  Gastrointestinal: Positive for abdominal pain, anorexia, nausea and vomiting. Negative for diarrhea.  Genitourinary: Negative for dysuria and frequency.  All other systems reviewed and are negative.    Physical Exam Updated Vital Signs BP (!) 139/99 (BP Location: Left Arm)   Pulse (!) 58   Temp 98.2 F (36.8 C) (Oral)   Resp 18   Ht 5\' 11"  (1.803 m)   Wt 68.9 kg (152 lb)   SpO2 100%   BMI 21.20 kg/m   Physical Exam  Constitutional: He is oriented to person, place, and time. He appears well-developed and well-nourished. No distress.  HENT:  Head: Normocephalic and atraumatic.  Mouth/Throat: Oropharynx is clear and moist.  Dry mucous membranes  Eyes: Pupils are equal, round, and reactive to light. Conjunctivae and EOM are normal.  Neck: Normal range of motion. Neck supple.  Cardiovascular: Normal rate, regular rhythm and intact distal pulses.  No murmur heard. Pulmonary/Chest: Effort normal and breath sounds normal. No respiratory distress. He has no wheezes. He has no rales.  Abdominal: Soft. He exhibits no distension. There is tenderness in the epigastric area and left upper quadrant. There is guarding. There is no rebound.  Musculoskeletal: Normal range of motion. He exhibits no edema or tenderness.  Neurological: He is alert and oriented to person, place, and time.  Skin: Skin is warm and dry. No rash noted. No erythema.  Psychiatric: He has a normal mood and affect. His behavior is normal.  Nursing note and vitals reviewed.    ED Treatments / Results  Labs (all labs ordered are listed, but only abnormal results are  displayed) Labs Reviewed  COMPREHENSIVE METABOLIC PANEL - Abnormal; Notable for the following components:      Result Value   Calcium 8.7 (*)    ALT 16 (*)    All other components within normal limits  CBC WITH DIFFERENTIAL/PLATELET  LIPASE, BLOOD  URINALYSIS, ROUTINE W REFLEX MICROSCOPIC    EKG None  Radiology No results found.  Procedures Procedures (including critical care time)  Medications Ordered in ED Medications  ondansetron (ZOFRAN) injection 4 mg (has no administration in time range)  morphine 4 MG/ML injection 4 mg (has no administration in time range)  sodium chloride 0.9 % bolus 1,000 mL (1,000 mLs Intravenous New Bag/Given 10/13/17 2251)     Initial Impression / Assessment and Plan / ED Course  I have reviewed the triage vital signs and the nursing notes.  Pertinent labs & imaging results that were available during my care of the patient were reviewed by me and considered in my medical decision making (see chart for details).     Pt seen at [facility] yesterday due to 1 week of abd pain and vomiting and dx with pancreatitis.  Neg CT and given PPI and zofran.  Pt states the pain has continued and he doesn't feel better.  Still pain in the epigastric area and LUQ.  No vomiting today but scared to eat.  No prior abd surgeries.  Labs today improved with normal LFT's and normal lipase.  Will hydrate and give pain control. 1:21 AM Pain controlled.  Pt feeling better and tolerating po's. Final Clinical Impressions(s) / ED Diagnoses   Final diagnoses:  Left lower quadrant pain    ED Discharge Orders        Ordered    HYDROcodone-acetaminophen (NORCO/VICODIN) 5-325 MG tablet  Every 6 hours PRN     10/14/17 0120       Gwyneth Sprout, MD 10/14/17 0121

## 2017-10-14 ENCOUNTER — Encounter (HOSPITAL_BASED_OUTPATIENT_CLINIC_OR_DEPARTMENT_OTHER): Payer: Self-pay | Admitting: Emergency Medicine

## 2017-10-14 ENCOUNTER — Other Ambulatory Visit: Payer: Self-pay

## 2017-10-14 ENCOUNTER — Emergency Department (HOSPITAL_BASED_OUTPATIENT_CLINIC_OR_DEPARTMENT_OTHER)
Admission: EM | Admit: 2017-10-14 | Discharge: 2017-10-14 | Disposition: A | Discharge status: Home / Self Care | Payer: Self-pay | Attending: Emergency Medicine | Admitting: Emergency Medicine

## 2017-10-14 DIAGNOSIS — F17210 Nicotine dependence, cigarettes, uncomplicated: Secondary | ICD-10-CM | POA: Insufficient documentation

## 2017-10-14 DIAGNOSIS — K029 Dental caries, unspecified: Secondary | ICD-10-CM | POA: Insufficient documentation

## 2017-10-14 DIAGNOSIS — R1013 Epigastric pain: Secondary | ICD-10-CM | POA: Insufficient documentation

## 2017-10-14 DIAGNOSIS — Z79899 Other long term (current) drug therapy: Secondary | ICD-10-CM | POA: Insufficient documentation

## 2017-10-14 LAB — URINALYSIS, MICROSCOPIC (REFLEX): BACTERIA UA: NONE SEEN

## 2017-10-14 LAB — URINALYSIS, ROUTINE W REFLEX MICROSCOPIC
Bilirubin Urine: NEGATIVE
GLUCOSE, UA: NEGATIVE mg/dL
Ketones, ur: 15 mg/dL — AB
LEUKOCYTES UA: NEGATIVE
Nitrite: NEGATIVE
PH: 6 (ref 5.0–8.0)
PROTEIN: NEGATIVE mg/dL
SPECIFIC GRAVITY, URINE: 1.02 (ref 1.005–1.030)

## 2017-10-14 MED ORDER — BUPIVACAINE-EPINEPHRINE (PF) 0.5% -1:200000 IJ SOLN
INTRAMUSCULAR | Status: AC
  Administered 2017-10-14: 20:00:00
  Filled 2017-10-14: qty 1.8

## 2017-10-14 MED ORDER — HYDROCODONE-ACETAMINOPHEN 5-325 MG PO TABS: 1.0000 | ORAL_TABLET | Freq: Four times a day (QID) | ORAL | 0 refills | Status: DC | PRN

## 2017-10-14 MED ORDER — AMOXICILLIN-POT CLAVULANATE 875-125 MG PO TABS
1.0000 | ORAL_TABLET | Freq: Once | ORAL | Status: AC
  Administered 2017-10-14: 1 via ORAL
  Filled 2017-10-14: qty 1

## 2017-10-14 MED ORDER — AMOXICILLIN-POT CLAVULANATE 875-125 MG PO TABS: 1.0000 | ORAL_TABLET | Freq: Two times a day (BID) | ORAL | 0 refills | Status: DC

## 2017-10-14 MED ORDER — SODIUM CHLORIDE 0.9 % IV BOLUS
1000.0000 mL | Freq: Once | INTRAVENOUS | Status: AC
  Administered 2017-10-14: 1000 mL via INTRAVENOUS

## 2017-10-14 MED ORDER — HYDROCODONE-ACETAMINOPHEN 5-325 MG PO TABS
1.0000 | ORAL_TABLET | Freq: Once | ORAL | Status: AC
  Administered 2017-10-14: 1 via ORAL
  Filled 2017-10-14: qty 1

## 2017-10-14 MED ORDER — BUPIVACAINE HCL 0.5 % IJ SOLN: 50.0000 mL | Freq: Once | INTRAMUSCULAR | Status: DC

## 2017-10-14 NOTE — ED Triage Notes (Addendum)
Patient states that he was here last night for his stomach. He states that he has pain to his left side of his mouth that they did not do anything about  - patient Is swishing water around in mouth because it makes that pain better. He is also in between swishing water vomiting. Patient advised that he should not be swishing water around in his mouth if he continues to vomit

## 2017-10-14 NOTE — ED Notes (Signed)
Alert, NAD, calm, interactive, resps e/u, speaking in clear complete sentences, no dyspnea noted, skin W&D, (denies: sob, nausea, dizziness or visual changes). States, "feel better". Denies questions or needs. Given Rx x1. Steady gait.

## 2017-10-14 NOTE — Discharge Instructions (Addendum)
Take augmentin twice daily for a week for your toothache   You need to see a dentist for follow up   Continue taking your vicodin as prescribed yesterday for pain   See your doctor  Return to ER if you have worse jaw swelling, abdominal pain, vomiting, fever

## 2017-10-14 NOTE — ED Provider Notes (Signed)
[facility] EMERGENCY DEPARTMENT Provider Note   CSN: [id number] Arrival date & time: 10/14/17  1740     History   Chief Complaint Chief Complaint  Patient presents with  . Dental Pain    HPI Jeffrey Monroe is a 32 y.o. male here with dental pain. Patient was seen at [place] [facility] 2 days ago and was prescribed pain meds for possible pancreatitis. Came yesterday and had normal lipase, normal LFTs and nl CT ab/pel. He was prescribed vicodin for pain. He states that he has L lower dental pain as well that has been there for several days. Denies fever. States that the abdominal hasn't changed.   The history is provided by the patient.    History reviewed. No pertinent past medical history.  There are no active problems to display for this patient.   History reviewed. No pertinent surgical history.      Home Medications    Prior to Admission medications   Medication Sig Start Date End Date Taking? Authorizing Provider  HYDROcodone-acetaminophen (NORCO/VICODIN) 5-325 MG tablet Take 1-2 tablets by mouth every 6 (six) hours as needed for severe pain. 10/14/17   Gwyneth SproutPlunkett, Whitney, MD  naproxen (NAPROSYN) 500 MG tablet Take 1 tablet (500 mg total) by mouth 2 (two) times daily. 06/07/17   Renne CriglerGeiple, Joshua, PA-C  pantoprazole (PROTONIX) 40 MG tablet Take 1 tablet (40 mg total) by mouth daily. 03/31/17 04/30/17  Long, Arlyss RepressJoshua G, MD  penicillin v potassium (VEETID) 500 MG tablet Take 1 tablet (500 mg total) by mouth 3 (three) times daily. 06/07/17   Renne CriglerGeiple, Joshua, PA-C  sucralfate (CARAFATE) 1 g tablet Take 1 tablet (1 g total) by mouth 4 (four) times daily -  with meals and at bedtime. 03/31/17 04/30/17  Long, Arlyss RepressJoshua G, MD    Family History History reviewed. No pertinent family history.  Social History Social History   Tobacco Use  . Smoking status: Current Every Day Smoker    Packs/day: 1.00    Types: Cigarettes  . Smokeless tobacco: Never Used  Substance Use Topics  . Alcohol  use: Yes    Comment: occ  . Drug use: No     Allergies   Patient has no known allergies.   Review of Systems Review of Systems  HENT: Positive for dental problem.   Gastrointestinal: Positive for abdominal pain.  All other systems reviewed and are negative.    Physical Exam Updated Vital Signs BP (!) 143/92 (BP Location: Right Arm)   Pulse (!) 59   Resp 16   Ht 5\' 11"  (1.803 m)   Wt 68.9 kg (152 lb)   SpO2 100%   BMI 21.20 kg/m   Physical Exam  Constitutional: He is oriented to person, place, and time. He appears well-developed and well-nourished.  HENT:  Head: Normocephalic.  Mouth/Throat:    Large cavity L lower premolar. Posterior pharynx clear, uvula midline   Eyes: Pupils are equal, round, and reactive to light. EOM are normal.  Neck: Normal range of motion. Neck supple.  No cervical LAD   Cardiovascular: Normal rate.  Pulmonary/Chest: Effort normal and breath sounds normal. No stridor. No respiratory distress.  Abdominal: Soft.  Mild epigastric tenderness, no rebound   Musculoskeletal: Normal range of motion.  Neurological: He is alert and oriented to person, place, and time.  Skin: Skin is warm.  Psychiatric: He has a normal mood and affect.  Nursing note and vitals reviewed.    ED Treatments / Results  Labs (all  labs ordered are listed, but only abnormal results are displayed) Labs Reviewed - No data to display  EKG None  Radiology No results found.  Procedures Dental Block Date/Time: 10/14/2017 8:44 PM Performed by: Charlynne Pander, MD Authorized by: Charlynne Pander, MD   Consent:    Consent obtained:  Verbal   Consent given by:  Patient   Risks discussed:  Infection and allergic reaction   Alternatives discussed:  No treatment Indications:    Indications: dental pain   Location:    Block type:  Inferior alveolar Procedure details (see MAR for exact dosages):    Syringe type:  Aspirating dental syringe   Needle gauge:  25  G   Anesthetic injected:  Bupivacaine 0.5% w/o epi   Injection procedure:  Anatomic landmarks identified Post-procedure details:    Outcome:  Anesthesia achieved   Patient tolerance of procedure:  Tolerated well, no immediate complications   (including critical care time)  Medications Ordered in ED Medications  bupivacaine (MARCAINE) 0.5 % (with pres) injection 50 mL (50 mLs Infiltration Not Given 10/14/17 2008)  amoxicillin-clavulanate (AUGMENTIN) 875-125 MG per tablet 1 tablet (1 tablet Oral Given 10/14/17 2010)  bupivacaine-epinephrine (MARCAINE W/ EPI) 0.5% -1:200000 injection (  Given 10/14/17 2009)     Initial Impression / Assessment and Plan / ED Course  I have reviewed the triage vital signs and the nursing notes.  Pertinent labs & imaging results that were available during my care of the patient were reviewed by me and considered in my medical decision making (see chart for details).    Jeffrey Monroe is a 32 y.o. male here with dental pain, abdominal pain. Already worked up for abdominal pain yesterday and had unremarkable labs and CT. He does have a large cavity L lower premolar. No signs of ludwig's or deep space abscess. Dental block performed. Will dc home with augmentin. Already prescribed vicodin from yesterday.    Final Clinical Impressions(s) / ED Diagnoses   Final diagnoses:  None    ED Discharge Orders    None       Charlynne Pander, MD 10/14/17 2046

## 2017-10-14 NOTE — Discharge Instructions (Signed)
Avoid alcohol.  For the next few days eat a bland diet and mostly clears like broth, water, gatorade, jello and applesauce.  Advance your diet when the pain stops

## 2018-12-12 IMAGING — CR DG CHEST 2V
2 series · 2 of 2 positions shown · non-contrast
Comparison: November 04, 2016

CLINICAL DATA: Chest pain and nausea

EXAM:
CHEST  2 VIEW

[w chest pa]
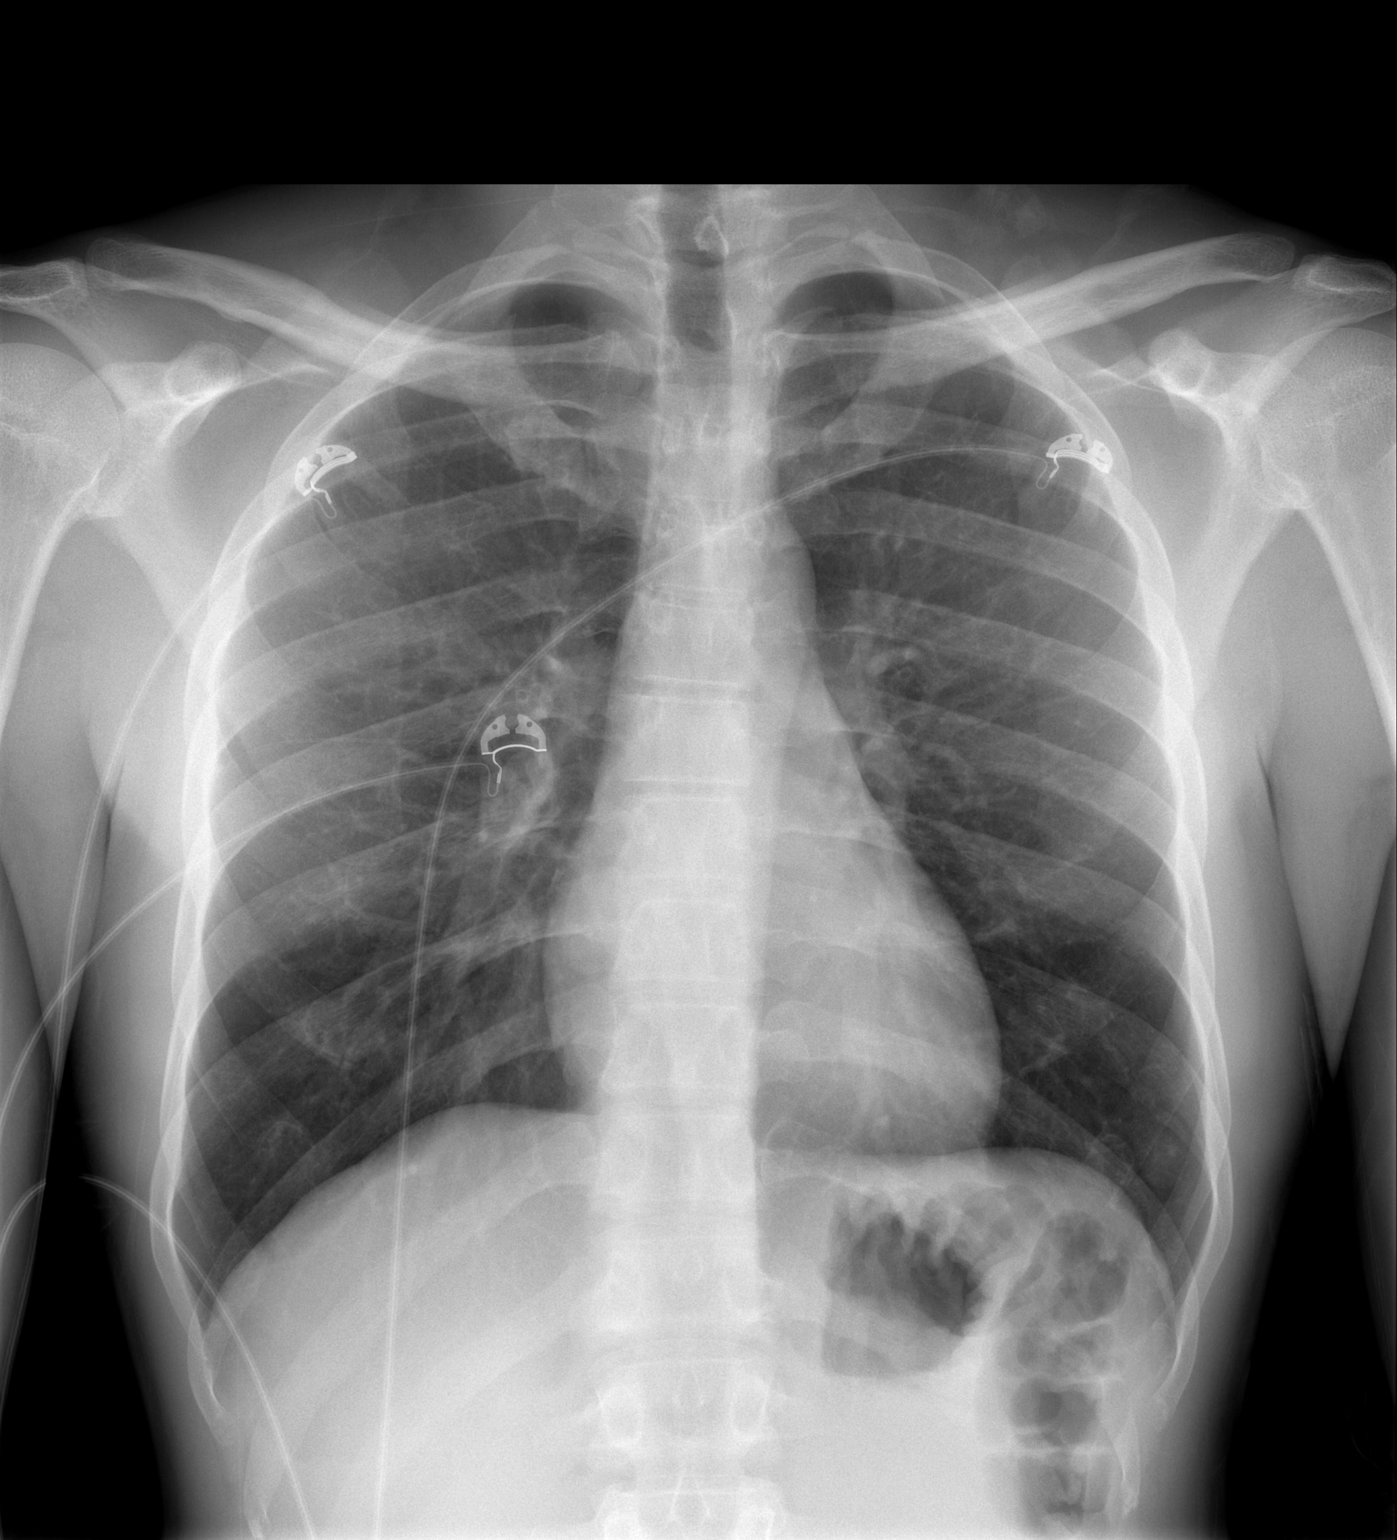

[w chest lat]
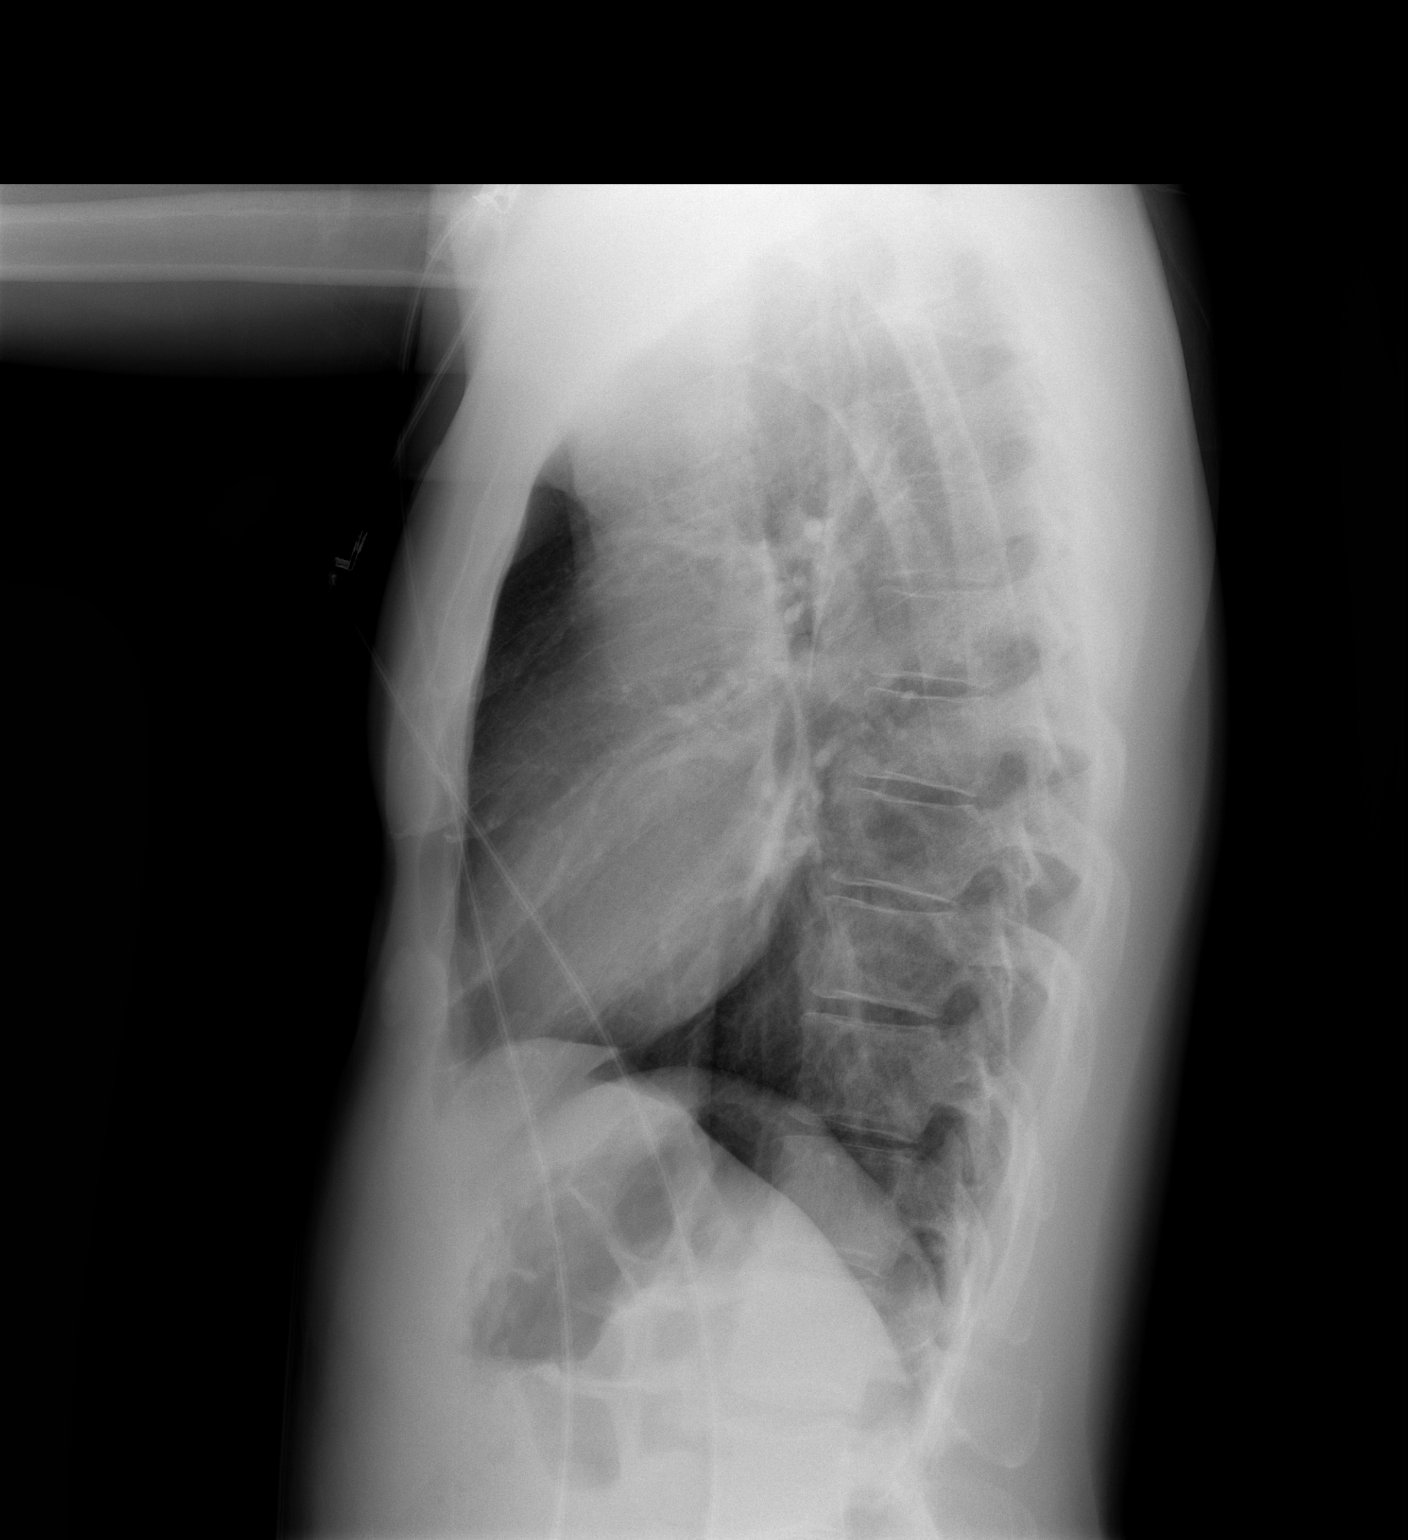

[2 of 2 positions shown; findings below may reference images not displayed]

FINDINGS: There is no edema or consolidation. The heart size and pulmonary
vascularity are normal. No adenopathy. No pneumothorax. No bone
lesions.
IMPRESSION: No edema or consolidation.

## 2019-09-07 ENCOUNTER — Encounter (HOSPITAL_BASED_OUTPATIENT_CLINIC_OR_DEPARTMENT_OTHER): Payer: Self-pay | Admitting: Emergency Medicine

## 2019-09-07 ENCOUNTER — Other Ambulatory Visit: Payer: Self-pay

## 2019-09-07 ENCOUNTER — Emergency Department (HOSPITAL_BASED_OUTPATIENT_CLINIC_OR_DEPARTMENT_OTHER)
Admission: EM | Admit: 2019-09-07 | Discharge: 2019-09-08 | Disposition: A | Discharge status: Home / Self Care | Payer: Self-pay | Attending: Emergency Medicine | Admitting: Emergency Medicine

## 2019-09-07 DIAGNOSIS — F17210 Nicotine dependence, cigarettes, uncomplicated: Secondary | ICD-10-CM | POA: Insufficient documentation

## 2019-09-07 DIAGNOSIS — Z79899 Other long term (current) drug therapy: Secondary | ICD-10-CM | POA: Insufficient documentation

## 2019-09-07 DIAGNOSIS — R1013 Epigastric pain: Secondary | ICD-10-CM | POA: Insufficient documentation

## 2019-09-07 LAB — COMPREHENSIVE METABOLIC PANEL
ALT: 21 U/L (ref 0–44)
AST: 24 U/L (ref 15–41)
Albumin: 4.5 g/dL (ref 3.5–5.0)
Alkaline Phosphatase: 70 U/L (ref 38–126)
Anion gap: 12 (ref 5–15)
BUN: 15 mg/dL (ref 6–20)
CO2: 23 mmol/L (ref 22–32)
Calcium: 8.9 mg/dL (ref 8.9–10.3)
Chloride: 100 mmol/L (ref 98–111)
Creatinine, Ser: 0.76 mg/dL (ref 0.61–1.24)
GFR calc Af Amer: >60 mL/min (ref >60)
GFR calc non Af Amer: >60 mL/min (ref >60)
Glucose, Bld: 83 mg/dL (ref 70–99)
Potassium: 4 mmol/L (ref 3.5–5.1)
Sodium: 135 mmol/L (ref 135–145)
Total Bilirubin: 1.8 mg/dL — ABNORMAL HIGH (ref 0.3–1.2)
Total Protein: 7.2 g/dL (ref 6.5–8.1)

## 2019-09-07 LAB — CBC
HCT: 43.6 % (ref 39.0–52.0)
Hemoglobin: 14.9 g/dL (ref 13.0–17.0)
MCH: 32.3 pg (ref 26.0–34.0)
MCHC: 34.2 g/dL (ref 30.0–36.0)
MCV: 94.4 fL (ref 80.0–100.0)
Platelets: 207 10*3/uL (ref 150–400)
RBC: 4.62 MIL/uL (ref 4.22–5.81)
RDW: 12.4 % (ref 11.5–15.5)
WBC: 6 10*3/uL (ref 4.0–10.5)
nRBC: 0 % (ref 0.0–0.2)

## 2019-09-07 LAB — LIPASE, BLOOD: Lipase: 26 U/L (ref 11–51)

## 2019-09-07 MED ORDER — SUCRALFATE 1 GM/10ML PO SUSP
1.0000 g | Freq: Once | ORAL | Status: AC
  Administered 2019-09-08: 1 g via ORAL
  Filled 2019-09-07: qty 10

## 2019-09-07 MED ORDER — SODIUM CHLORIDE 0.9% FLUSH
3.0000 mL | Freq: Once | INTRAVENOUS | Status: DC
  Filled 2019-09-07: qty 3

## 2019-09-07 NOTE — ED Triage Notes (Signed)
Patient states that he was just released from [facility] for the same. He states that he has a ulcer - reports pain to his midepigatric region at this time. Denies any vomiting

## 2019-09-07 NOTE — ED Provider Notes (Signed)
[facility] DEPT [facility] Provider Note: Georgena Spurling, MD, FACEP  CSN: [id number] MRN: [id number] ARRIVAL: 09/07/19 at [place]: [place]  Abdominal Pain (epigastric pain )   HISTORY OF PRESENT ILLNESS  09/07/19 11:43 PM Jeffrey Monroe is a 34 y.o. male with a 2-day history of epigastric pain which he describes as burning.  He believes he has a history of peptic ulcer disease.  He was seen at [facility] ED yesterday about noon.  He was given a GI cocktail which caused some transient relief and then was discharged with a prescription for omeprazole.  No lab work or imaging studies were performed.  His discomfort has returned and he now has moderate burning pain in his epigastrium.  He has associated nausea but no vomiting.  History reviewed. No pertinent past medical history.  History reviewed. No pertinent surgical history.  History reviewed. No pertinent family history.  Social History   Tobacco Use  . Smoking status: Current Every Day Smoker    Packs/day: 1.00    Types: Cigarettes  . Smokeless tobacco: Never Used  Substance Use Topics  . Alcohol use: Yes    Comment: occ  . Drug use: No    Prior to Admission medications   Medication Sig Start Date End Date Taking? Authorizing Provider  sucralfate (CARAFATE) 1 g tablet Take 1 tablet (1 g total) by mouth 4 (four) times daily -  with meals and at bedtime. Take omeprazole every morning at least 30 minutes before first dose of Carafate. 09/08/19   Tahesha Skeet, MD  pantoprazole (PROTONIX) 40 MG tablet Take 1 tablet (40 mg total) by mouth daily. 03/31/17 09/08/19  Long, Wonda Olds, MD    Allergies Patient has no known allergies.   REVIEW OF SYSTEMS  Negative except as noted here or in the History of Present Illness.   PHYSICAL EXAMINATION  Initial Vital Signs Blood pressure 123/84, pulse 62, temperature 98.3 F (36.8 C), temperature source Oral, resp. rate 16, height 5\' 10"  (1.778 m), weight 65.8  kg, SpO2 99 %.  Examination General: Well-developed, well-nourished male in no acute distress; appearance consistent with age of record HENT: normocephalic; atraumatic Eyes: pupils equal, round and reactive to light; extraocular muscles intact Neck: supple Heart: regular rate and rhythm Lungs: clear to auscultation bilaterally Abdomen: soft; nondistended; epigastric tenderness; no masses or hepatosplenomegaly; bowel sounds present Extremities: No deformity; full range of motion; pulses normal Neurologic: Awake, alert and oriented; motor function intact in all extremities and symmetric; no facial droop Skin: Warm and dry Psychiatric: Normal mood and affect   RESULTS  Summary of this visit's results, reviewed and interpreted by myself:   EKG Interpretation  Date/Time:    Ventricular Rate:    PR Interval:    QRS Duration:   QT Interval:    QTC Calculation:   R Axis:     Text Interpretation:        Laboratory Studies: Results for orders placed or performed during the hospital encounter of 09/07/19 (from the past 24 hour(s))  Lipase, blood     Status: None   Collection Time: 09/07/19 11:18 PM  Result Value Ref Range   Lipase 26 11 - 51 U/L  Comprehensive metabolic panel     Status: Abnormal   Collection Time: 09/07/19 11:18 PM  Result Value Ref Range   Sodium 135 135 - 145 mmol/L   Potassium 4.0 3.5 - 5.1 mmol/L   Chloride 100 98 - 111 mmol/L  CO2 23 22 - 32 mmol/L   Glucose, Bld 83 70 - 99 mg/dL   BUN 15 6 - 20 mg/dL   Creatinine, Ser 8.10 0.61 - 1.24 mg/dL   Calcium 8.9 8.9 - 17.5 mg/dL   Total Protein 7.2 6.5 - 8.1 g/dL   Albumin 4.5 3.5 - 5.0 g/dL   AST 24 15 - 41 U/L   ALT 21 0 - 44 U/L   Alkaline Phosphatase 70 38 - 126 U/L   Total Bilirubin 1.8 (H) 0.3 - 1.2 mg/dL   GFR calc non Af Amer >60 >60 mL/min   GFR calc Af Amer >60 >60 mL/min   Anion gap 12 5 - 15  CBC     Status: None   Collection Time: 09/07/19 11:18 PM  Result Value Ref Range   WBC 6.0 4.0 -  10.5 K/uL   RBC 4.62 4.22 - 5.81 MIL/uL   Hemoglobin 14.9 13.0 - 17.0 g/dL   HCT 10.2 58.5 - 27.7 %   MCV 94.4 80.0 - 100.0 fL   MCH 32.3 26.0 - 34.0 pg   MCHC 34.2 30.0 - 36.0 g/dL   RDW 82.4 23.5 - 36.1 %   Platelets 207 150 - 400 K/uL   nRBC 0.0 0.0 - 0.2 %   Imaging Studies: No results found.  ED COURSE and MDM  Nursing notes, initial and subsequent vitals signs, including pulse oximetry, reviewed and interpreted by myself.  Vitals:   09/07/19 1924 09/07/19 1925 09/07/19 2303  BP: 123/78  123/84  Pulse: 61  62  Resp: 18  16  Temp: 98.3 F (36.8 C)    TempSrc: Oral    SpO2: 100%  99%  Weight:  65.8 kg   Height:  5\' 10"  (1.778 m)    Medications  sodium chloride flush (NS) 0.9 % injection 3 mL (3 mLs Intravenous Not Given 09/07/19 2308)  sucralfate (CARAFATE) 1 GM/10ML suspension 1 g (1 g Oral Given 09/08/19 0025)   1:25 AM Patient given Carafate (he had been given a GI cocktail at [facility] earlier).  He was advised to continue the omeprazole and we will add Carafate and refer to gastroenterology for suspected gastritis versus peptic ulcer disease.   PROCEDURES  Procedures   ED DIAGNOSES     ICD-10-CM   1. Epigastric pain  R10.13        Kiyomi Pallo, [facility], MD 09/08/19 0127

## 2019-09-08 MED ORDER — SUCRALFATE 1 G PO TABS: 1.0000 g | ORAL_TABLET | Freq: Three times a day (TID) | ORAL | 0 refills | Status: AC

## 2021-01-13 ENCOUNTER — Emergency Department (HOSPITAL_BASED_OUTPATIENT_CLINIC_OR_DEPARTMENT_OTHER)
Admission: EM | Admit: 2021-01-13 | Discharge: 2021-01-13 | Disposition: A | Discharge status: Home / Self Care | Payer: Self-pay | Attending: Emergency Medicine | Admitting: Emergency Medicine

## 2021-01-13 ENCOUNTER — Other Ambulatory Visit: Payer: Self-pay

## 2021-01-13 ENCOUNTER — Other Ambulatory Visit (HOSPITAL_BASED_OUTPATIENT_CLINIC_OR_DEPARTMENT_OTHER): Payer: Self-pay

## 2021-01-13 ENCOUNTER — Emergency Department (HOSPITAL_BASED_OUTPATIENT_CLINIC_OR_DEPARTMENT_OTHER): Payer: Self-pay

## 2021-01-13 ENCOUNTER — Encounter (HOSPITAL_BASED_OUTPATIENT_CLINIC_OR_DEPARTMENT_OTHER): Payer: Self-pay | Admitting: Emergency Medicine

## 2021-01-13 DIAGNOSIS — R1011 Right upper quadrant pain: Secondary | ICD-10-CM | POA: Insufficient documentation

## 2021-01-13 DIAGNOSIS — F17210 Nicotine dependence, cigarettes, uncomplicated: Secondary | ICD-10-CM | POA: Insufficient documentation

## 2021-01-13 DIAGNOSIS — K5900 Constipation, unspecified: Secondary | ICD-10-CM | POA: Insufficient documentation

## 2021-01-13 DIAGNOSIS — R112 Nausea with vomiting, unspecified: Secondary | ICD-10-CM | POA: Insufficient documentation

## 2021-01-13 HISTORY — DX: Acute pancreatitis without necrosis or infection, unspecified: K85.90

## 2021-01-13 LAB — COMPREHENSIVE METABOLIC PANEL
ALT: 8 U/L (ref 0–44)
AST: 14 U/L — ABNORMAL LOW (ref 15–41)
Albumin: 4.2 g/dL (ref 3.5–5.0)
Alkaline Phosphatase: 66 U/L (ref 38–126)
Anion gap: 6 (ref 5–15)
BUN: 10 mg/dL (ref 6–20)
CO2: 26 mmol/L (ref 22–32)
Calcium: 8.9 mg/dL (ref 8.9–10.3)
Chloride: 105 mmol/L (ref 98–111)
Creatinine, Ser: 0.92 mg/dL (ref 0.61–1.24)
GFR, Estimated: >60 mL/min (ref >60)
Glucose, Bld: 92 mg/dL (ref 70–99)
Potassium: 4 mmol/L (ref 3.5–5.1)
Sodium: 137 mmol/L (ref 135–145)
Total Bilirubin: 0.9 mg/dL (ref 0.3–1.2)
Total Protein: 7.3 g/dL (ref 6.5–8.1)

## 2021-01-13 LAB — CBC WITH DIFFERENTIAL/PLATELET
Abs Immature Granulocytes: 0.01 10*3/uL (ref 0.00–0.07)
Basophils Absolute: 0 10*3/uL (ref 0.0–0.1)
Basophils Relative: 1 %
Eosinophils Absolute: 0.5 10*3/uL (ref 0.0–0.5)
Eosinophils Relative: 10 %
HCT: 40 % (ref 39.0–52.0)
Hemoglobin: 13.5 g/dL (ref 13.0–17.0)
Immature Granulocytes: 0 %
Lymphocytes Relative: 39 %
Lymphs Abs: 2 10*3/uL (ref 0.7–4.0)
MCH: 32.4 pg (ref 26.0–34.0)
MCHC: 33.8 g/dL (ref 30.0–36.0)
MCV: 95.9 fL (ref 80.0–100.0)
Monocytes Absolute: 0.5 10*3/uL (ref 0.1–1.0)
Monocytes Relative: 9 %
Neutro Abs: 2 10*3/uL (ref 1.7–7.7)
Neutrophils Relative %: 41 %
Platelets: 183 10*3/uL (ref 150–400)
RBC: 4.17 MIL/uL — ABNORMAL LOW (ref 4.22–5.81)
RDW: 12.8 % (ref 11.5–15.5)
WBC: 5.1 10*3/uL (ref 4.0–10.5)
nRBC: 0 % (ref 0.0–0.2)

## 2021-01-13 LAB — LIPASE, BLOOD: Lipase: 32 U/L (ref 11–51)

## 2021-01-13 MED ORDER — SODIUM CHLORIDE 0.9 % IV BOLUS
1000.0000 mL | Freq: Once | INTRAVENOUS | Status: AC
  Administered 2021-01-13: 1000 mL via INTRAVENOUS

## 2021-01-13 MED ORDER — SUCRALFATE 1 G PO TABS
1.0000 g | ORAL_TABLET | Freq: Three times a day (TID) | ORAL | 0 refills | Status: AC
  Filled 2021-01-13: qty 28, 7d supply, fill #0

## 2021-01-13 MED ORDER — ALUM & MAG HYDROXIDE-SIMETH 200-200-20 MG/5ML PO SUSP
15.0000 mL | Freq: Once | ORAL | Status: AC
  Administered 2021-01-13: 15 mL via ORAL
  Filled 2021-01-13: qty 30

## 2021-01-13 MED ORDER — ONDANSETRON HCL 4 MG/2ML IJ SOLN
4.0000 mg | Freq: Once | INTRAMUSCULAR | Status: AC
  Administered 2021-01-13: 4 mg via INTRAVENOUS
  Filled 2021-01-13: qty 2

## 2021-01-13 MED ORDER — FAMOTIDINE IN NACL 20-0.9 MG/50ML-% IV SOLN
20.0000 mg | Freq: Once | INTRAVENOUS | Status: AC
  Administered 2021-01-13: 20 mg via INTRAVENOUS
  Filled 2021-01-13: qty 50

## 2021-01-13 MED ORDER — ONDANSETRON 4 MG PO TBDP
4.0000 mg | ORAL_TABLET | Freq: Three times a day (TID) | ORAL | 0 refills | Status: AC | PRN
  Filled 2021-01-13: qty 12, 4d supply, fill #0

## 2021-01-13 NOTE — ED Notes (Signed)
Pt given gingerale and crackers 

## 2021-01-13 NOTE — ED Notes (Signed)
Pt given ginger ale.

## 2021-01-13 NOTE — Discharge Instructions (Addendum)
You should return to the hospital if you experience return of persistent nausea and vomiting that does not resolve and does not allow you to tolerate any food or fluids, persistent fevers for greater than 2-3 more days, increasing abdominal pain that persists despite medications, persistent diarrhea, dizziness, syncope (fainting), or for any other concerns.    Please return to the emergency department immediately for any new or concerning symptoms, or if you get worse. 

## 2021-01-13 NOTE — ED Triage Notes (Signed)
Abd  pain x 1 month has hx of pancreatitis, some nausea  denies v/d , pain makes his back hurts, denies dysuria ,  hurts to poop he states  last BM 2  days

## 2021-01-13 NOTE — ED Provider Notes (Signed)
[facility] EMERGENCY DEPARTMENT Provider Note   CSN: [id number] Arrival date & time: 01/13/21  0845     History Chief Complaint  Patient presents with   Abdominal Pain    Jeffrey Monroe is a 35 y.o. male.  35 year old male with history of pancreatitis presents to the ER secondary to right upper quadrant, epigastric abdominal discomfort.  Pain described as burning, aching.  Intermittent radiation to his back.  Intermittent nausea and vomiting.  Pain worsened by eating of fatty foods.  No fevers, chills.  No change urination.  Reports some intermittent constipation over the past few months.  Pain has been ongoing for around a month but has worsened over the past few days.  No alcohol use.  No recent tobacco use.  No recent medication or dietary changes.  No recent travel or sick contacts.  No medications prior to arrival  The history is provided by the patient. No language interpreter was used.  Abdominal Pain Pain location:  Epigastric and RUQ Pain quality: aching, burning, cramping and gnawing   Pain radiates to:  Back Pain severity:  Mild Onset quality:  Gradual Duration:  4 weeks Associated symptoms: constipation, nausea and vomiting   Associated symptoms: no chest pain, no chills, no cough, no fever, no hematuria and no shortness of breath       Past Medical History:  Diagnosis Date   Pancreatitis     There are no problems to display for this patient.   History reviewed. No pertinent surgical history.     History reviewed. No pertinent family history.  Social History   Tobacco Use   Smoking status: Every Day    Packs/day: 1.00    Types: Cigarettes    Passive exposure: Current   Smokeless tobacco: Never  Vaping Use   Vaping Use: Never used  Substance Use Topics   Alcohol use: Yes    Comment: occ   Drug use: Yes    Types: Marijuana    Home Medications Prior to Admission medications   Medication Sig Start Date End Date Taking? Authorizing  Provider  ondansetron (ZOFRAN ODT) 4 MG disintegrating tablet Take 1 tablet (4 mg total) by mouth every 8 (eight) hours as needed for nausea or vomiting. 01/13/21  Yes Tanda Rockers A, DO  sucralfate (CARAFATE) 1 g tablet Take 1 tablet (1 g total) by mouth 4 (four) times daily -  with meals and at bedtime for 7 days. 01/13/21 01/20/21 Yes Tanda Rockers A, DO  sucralfate (CARAFATE) 1 g tablet Take 1 tablet (1 g total) by mouth 4 (four) times daily -  with meals and at bedtime. Take omeprazole every morning at least 30 minutes before first dose of Carafate. 09/08/19   Molpus, John, MD  pantoprazole (PROTONIX) 40 MG tablet Take 1 tablet (40 mg total) by mouth daily. 03/31/17 09/08/19  Long, Arlyss Repress, MD    Allergies    Patient has no known allergies.  Review of Systems   Review of Systems  Constitutional:  Negative for chills and fever.  HENT:  Negative for facial swelling and trouble swallowing.   Eyes:  Negative for photophobia and visual disturbance.  Respiratory:  Negative for cough and shortness of breath.   Cardiovascular:  Negative for chest pain and palpitations.  Gastrointestinal:  Positive for abdominal pain, constipation, nausea and vomiting.  Endocrine: Negative for polydipsia and polyuria.  Genitourinary:  Negative for difficulty urinating and hematuria.  Musculoskeletal:  Negative for gait problem and joint swelling.  Skin:  Negative for pallor and rash.  Neurological:  Negative for syncope and headaches.  Psychiatric/Behavioral:  Negative for agitation and confusion.    Physical Exam Updated Vital Signs BP 102/73   Pulse 68   Temp 98.6 F (37 C) (Oral)   Resp 20   Ht 5\' 10"  (1.778 m)   Wt 63.5 kg   SpO2 100%   BMI 20.09 kg/m   Physical Exam Vitals and nursing note reviewed.  Constitutional:      General: He is not in acute distress.    Appearance: He is well-developed.  HENT:     Head: Normocephalic and atraumatic.     Right Ear: External ear normal.     Left Ear:  External ear normal.     Mouth/Throat:     Mouth: Mucous membranes are moist.  Eyes:     General: No scleral icterus. Cardiovascular:     Rate and Rhythm: Normal rate and regular rhythm.     Pulses: Normal pulses.     Heart sounds: Normal heart sounds.  Pulmonary:     Effort: Pulmonary effort is normal. No respiratory distress.     Breath sounds: Normal breath sounds.  Abdominal:     General: Abdomen is flat. Bowel sounds are normal.     Palpations: Abdomen is soft.     Tenderness: There is abdominal tenderness in the right upper quadrant and epigastric area. There is guarding. There is no right CVA tenderness, left CVA tenderness or rebound. Negative signs include McBurney's sign.  Musculoskeletal:        General: Normal range of motion.     Cervical back: Normal range of motion.     Right lower leg: No edema.     Left lower leg: No edema.  Skin:    General: Skin is warm and dry.     Capillary Refill: Capillary refill takes less than 2 seconds.  Neurological:     Mental Status: He is alert and oriented to person, place, and time.  Psychiatric:        Mood and Affect: Mood normal.        Behavior: Behavior normal.    ED Results / Procedures / Treatments   Labs (all labs ordered are listed, but only abnormal results are displayed) Labs Reviewed  CBC WITH DIFFERENTIAL/PLATELET - Abnormal; Notable for the following components:      Result Value   RBC 4.17 (*)    All other components within normal limits  COMPREHENSIVE METABOLIC PANEL - Abnormal; Notable for the following components:   AST 14 (*)    All other components within normal limits  RESP PANEL BY RT-PCR (FLU A&B, COVID) ARPGX2  LIPASE, BLOOD  URINALYSIS, ROUTINE W REFLEX MICROSCOPIC    EKG None  Radiology Abdomen Limited RUQ (LIVER/GB)  Result Date: 01/13/2021 CLINICAL DATA:  History of pancreatitis.  Abdominal pain EXAM: ULTRASOUND ABDOMEN LIMITED RIGHT UPPER QUADRANT COMPARISON:  None. FINDINGS:  Gallbladder: No gallstones or wall thickening visualized. No sonographic Murphy sign noted by sonographer. Common bile duct: Diameter: Normal at 2 mm Liver: No focal lesion identified. Within normal limits in parenchymal echogenicity. Portal vein is patent on color Doppler imaging with normal direction of blood flow towards the liver. Other: None. IMPRESSION: Normal RIGHT upper quadrant ultrasound. Electronically Signed   By: 01/15/2021 M.D.   On: 01/13/2021 10:24    Procedures Procedures   Medications Ordered in ED Medications  sodium chloride 0.9 % bolus 1,000 mL (  0 mLs Intravenous Stopped 01/13/21 1134)  famotidine (PEPCID) IVPB 20 mg premix (0 mg Intravenous Stopped 01/13/21 1134)  ondansetron (ZOFRAN) injection 4 mg (4 mg Intravenous Given 01/13/21 1005)  alum & mag hydroxide-simeth (MAALOX/MYLANTA) 200-200-20 MG/5ML suspension 15 mL (15 mLs Oral Given 01/13/21 1007)    ED Course  I have reviewed the triage vital signs and the nursing notes.  Pertinent labs & imaging results that were available during my care of the patient were reviewed by me and considered in my medical decision making (see chart for details).  Clinical Course as of 01/13/21 1309  Wed Jan 13, 2021  1231 Patient re-assessed, feeling much better. Pain has resolved. Asking for something to eat. Labs and imaging reviewed and are re-assuring. Plan to PO challenge  [SG]    Clinical Course User Index [SG] Sloan Leiter, DO   MDM Rules/Calculators/A&P                         This patient complains of abdominal pain, N/V; this involves an extensive number of treatment Options and is a complaint that carries with it a high risk of complications and Morbidity. Vital signs reviewed and are stable. Serious etiologies considered.    I ordered imaging studies which included RUQ ultrasound and I independently visualized and interpreted imaging which showed no acute biliary abnormalities  Previous records obtained and  reviewed   Labs reviewed and are stable.  12:50pm Pt re-assessed and is feeling much better. He is ambulatory and able to tolerate oral intake. Rpt abdominal exam is soft, nontender, non-peritoneal.   Favor gastritis/gastroenteritis as likely etiology of complaint.    The patient's overall condition has improved, the patient presents with abdominal pain without signs of peritonitis, or other life-threatening serious etiology. The patient understands that at this time there is no evidence for a more malignant underlying process, but the patient also understands that early in the process of an illness, an emergency department workup can be falsely reassuring. Detailed discussions were had with the patient regarding current findings, and need for close f/u with PCP or on call doctor. The patient appears stable for discharge and has been instructed to return immediately if the symptoms worsen in any way or if not improved for re-evaluation. Patient verbalized understanding and is in agreement with current care plan.  All questions answered prior to discharge.   Final Clinical Impression(s) / ED Diagnoses Final diagnoses:  RUQ pain  Nausea and vomiting, intractability of vomiting not specified, unspecified vomiting type    Rx / DC Orders ED Discharge Orders          Ordered    ondansetron (ZOFRAN ODT) 4 MG disintegrating tablet  Every 8 hours PRN        01/13/21 1307    sucralfate (CARAFATE) 1 g tablet  3 times daily with meals & bedtime        01/13/21 1307             Sloan Leiter, DO 01/13/21 1309

## 2021-01-15 ENCOUNTER — Other Ambulatory Visit (HOSPITAL_BASED_OUTPATIENT_CLINIC_OR_DEPARTMENT_OTHER): Payer: Self-pay

## 2022-09-26 IMAGING — US US ABDOMEN LIMITED
1 series · 14 of 25 positions shown · non-contrast
Comparison: None.

CLINICAL DATA: History of pancreatitis.  Abdominal pain

EXAM:
ULTRASOUND ABDOMEN LIMITED RIGHT UPPER QUADRANT

[Series 2: us abdomen limited · 14 of 35 slices shown]
[im 1/35]
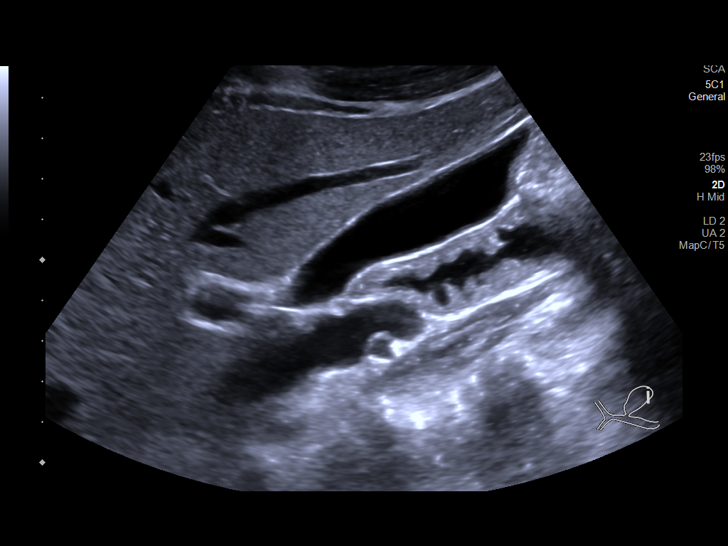
[im 3/35]
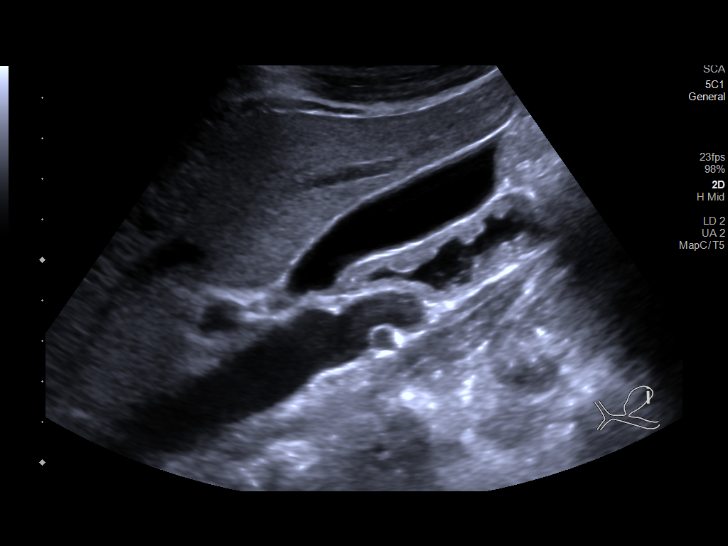
[im 6/35]
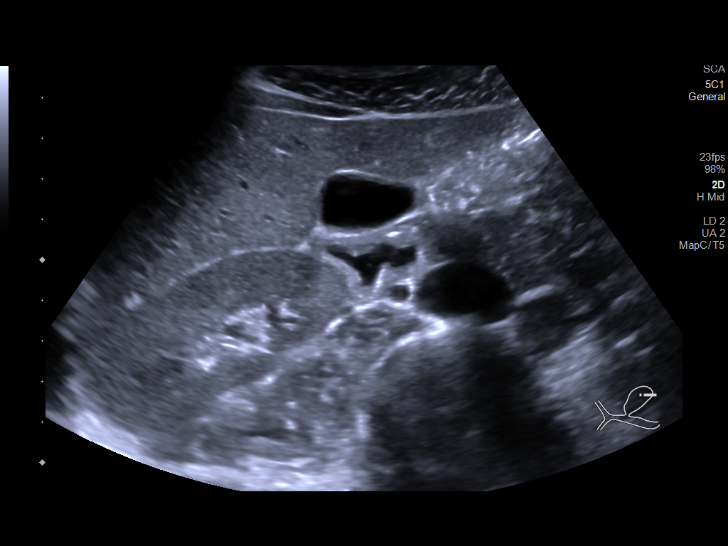
[im 9/35]
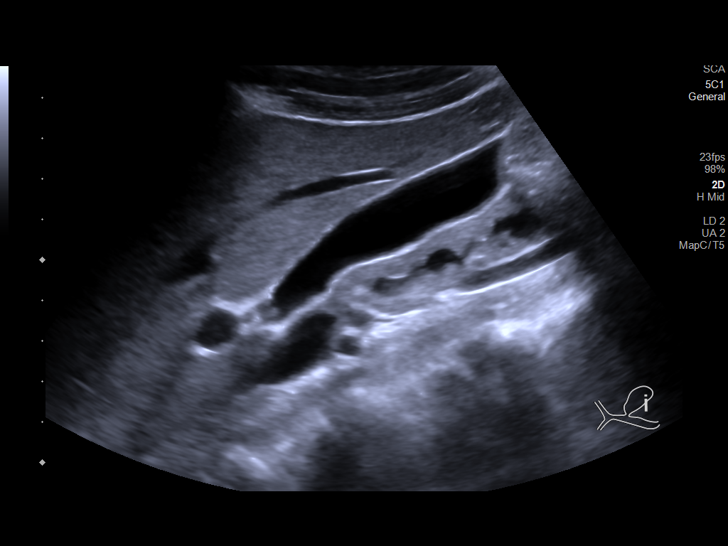
[im 12/35]
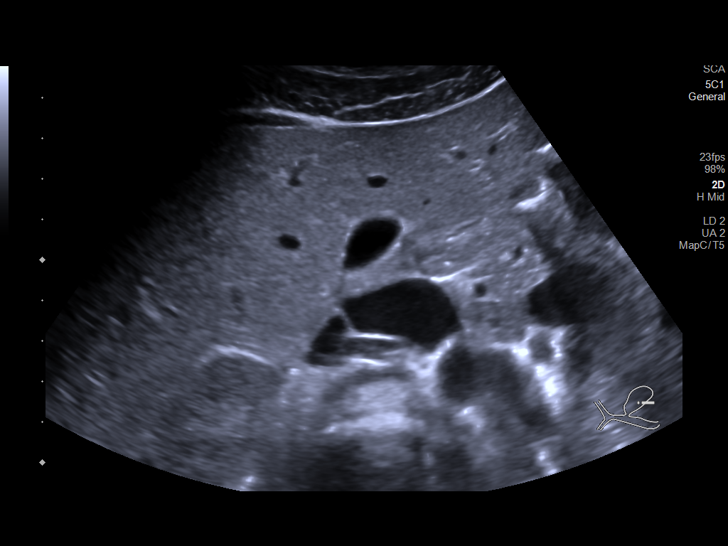
[im 13/35]
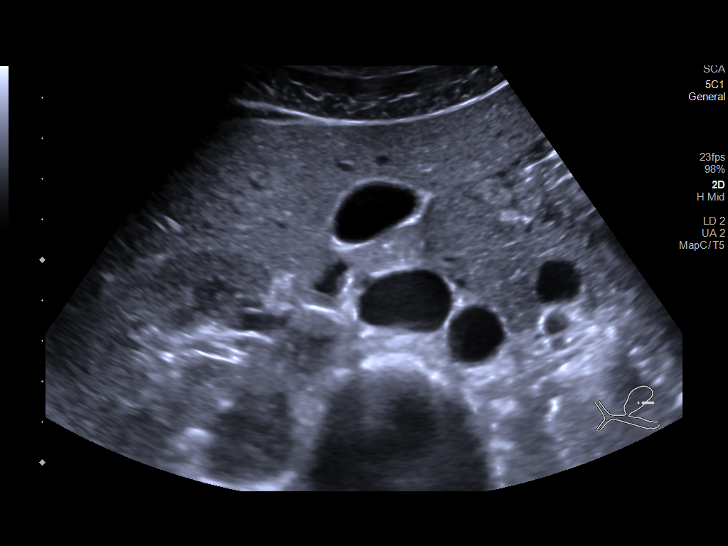
[im 16/35]
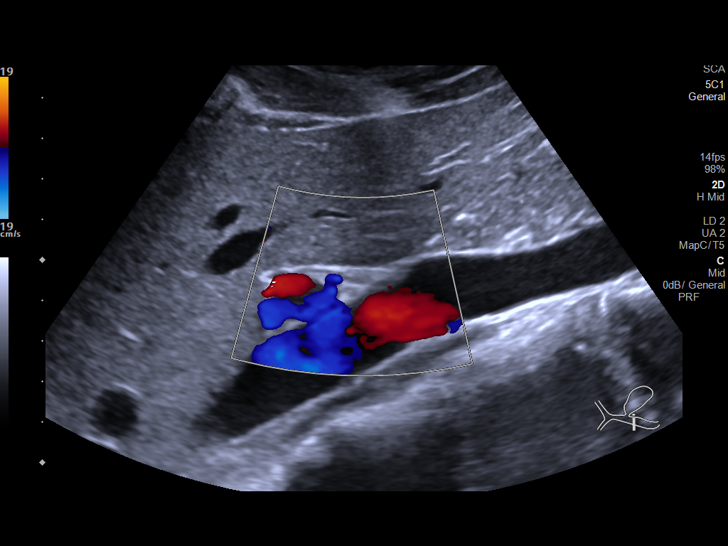
[im 19/35]
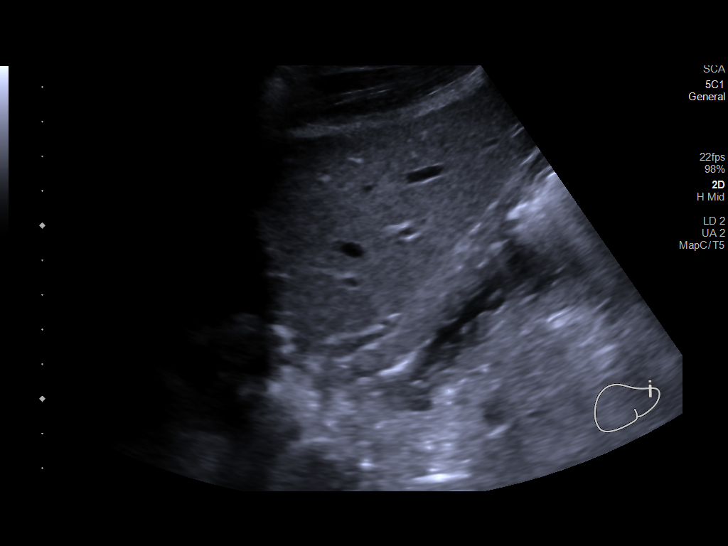
[im 22/35]
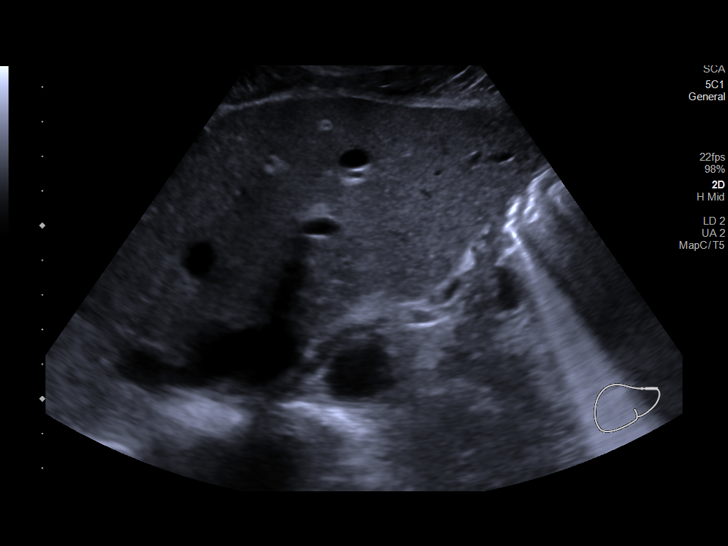
[im 23/35]
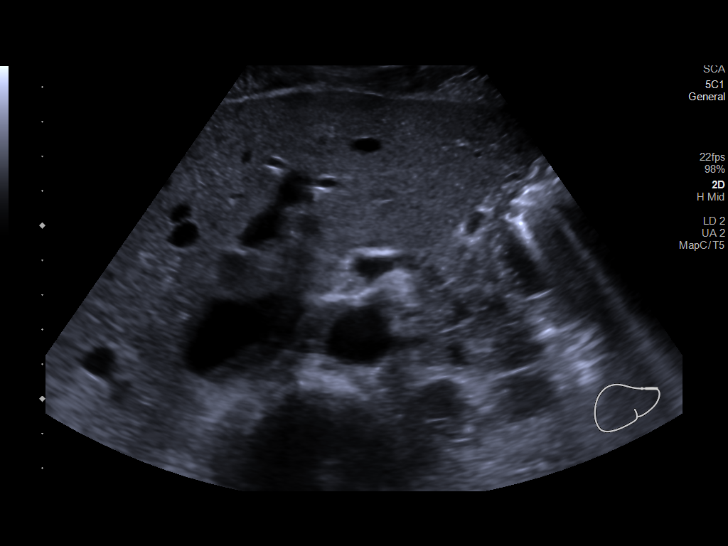
[im 26/35]
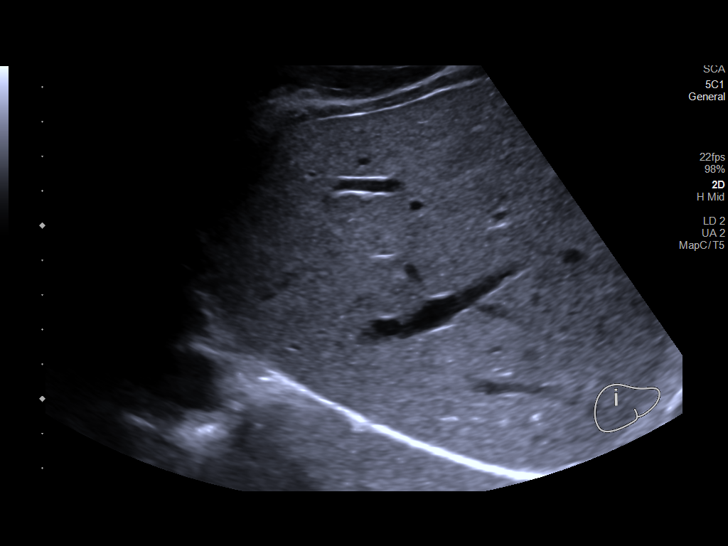
[im 29/35]
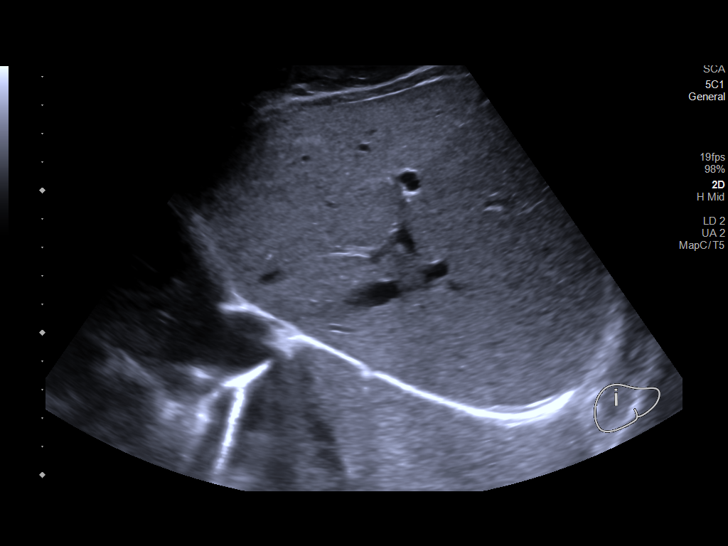
[im 32/35]
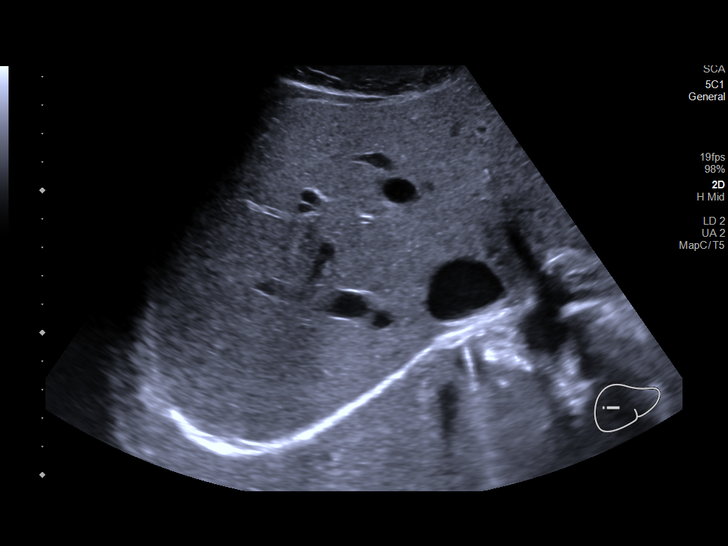
[im 35/35]
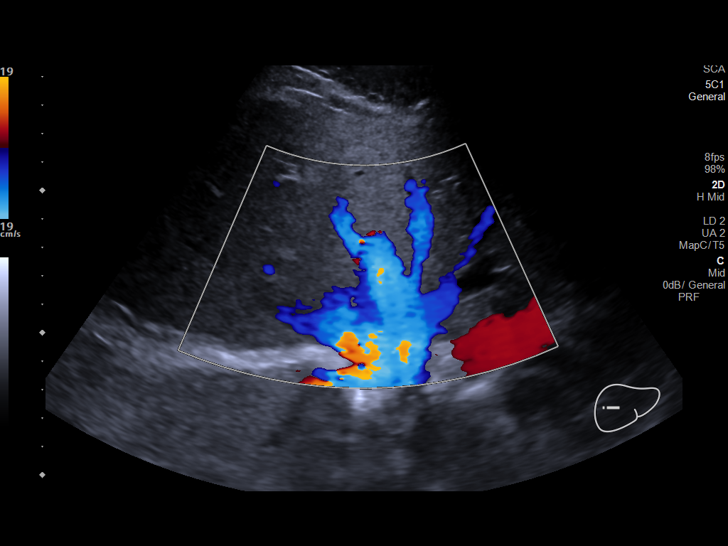

[14 of 25 positions shown; findings below may reference images not displayed]

FINDINGS: Gallbladder:

No gallstones or wall thickening visualized. No sonographic Murphy
sign noted by sonographer.

Common bile duct:

Diameter: Normal at 2 mm

Liver:

No focal lesion identified. Within normal limits in parenchymal
echogenicity. Portal vein is patent on color Doppler imaging with
normal direction of blood flow towards the liver.

Other: None.
IMPRESSION: Normal RIGHT upper quadrant ultrasound.

## 2022-10-13 ENCOUNTER — Emergency Department (HOSPITAL_BASED_OUTPATIENT_CLINIC_OR_DEPARTMENT_OTHER)
Admission: EM | Admit: 2022-10-13 | Discharge: 2022-10-13 | Disposition: A | Discharge status: Home / Self Care | Payer: Self-pay | Attending: Emergency Medicine | Admitting: Emergency Medicine

## 2022-10-13 ENCOUNTER — Other Ambulatory Visit: Payer: Self-pay

## 2022-10-13 ENCOUNTER — Encounter (HOSPITAL_BASED_OUTPATIENT_CLINIC_OR_DEPARTMENT_OTHER): Payer: Self-pay

## 2022-10-13 DIAGNOSIS — K0889 Other specified disorders of teeth and supporting structures: Secondary | ICD-10-CM | POA: Insufficient documentation

## 2022-10-13 MED ORDER — KETOROLAC TROMETHAMINE 15 MG/ML IJ SOLN
15.0000 mg | Freq: Once | INTRAMUSCULAR | Status: AC
  Administered 2022-10-13: 15 mg via INTRAMUSCULAR
  Filled 2022-10-13: qty 1

## 2022-10-13 MED ORDER — PENICILLIN V POTASSIUM 250 MG PO TABS
500.0000 mg | ORAL_TABLET | Freq: Once | ORAL | Status: AC
  Administered 2022-10-13: 500 mg via ORAL
  Filled 2022-10-13: qty 2

## 2022-10-13 MED ORDER — ACETAMINOPHEN 500 MG PO TABS
1000.0000 mg | ORAL_TABLET | Freq: Once | ORAL | Status: AC
  Administered 2022-10-13: 1000 mg via ORAL
  Filled 2022-10-13: qty 2

## 2022-10-13 MED ORDER — PENICILLIN V POTASSIUM 500 MG PO TABS: 500.0000 mg | ORAL_TABLET | Freq: Four times a day (QID) | ORAL | 0 refills | Status: AC

## 2022-10-13 NOTE — ED Provider Notes (Signed)
[place] EMERGENCY DEPARTMENT AT [facility] Provider Note   CSN: [id number] Arrival date & time: 10/13/22  2030     History Chief Complaint  Patient presents with   Dental Pain    HPI Jeffrey Monroe is a 37 y.o. male presenting for dental pain.  Right lower gum posterior molar where his wisdom tooth was.  States he did not have it removed and fractured on its own..   Patient's recorded medical, surgical, social, medication list and allergies were reviewed in the Snapshot window as part of the initial history.   Review of Systems   Review of Systems  Constitutional:  Negative for chills and fever.  HENT:  Positive for dental problem. Negative for ear pain and sore throat.   Eyes:  Negative for pain and visual disturbance.  Respiratory:  Negative for cough and shortness of breath.   Cardiovascular:  Negative for chest pain and palpitations.  Gastrointestinal:  Negative for abdominal pain and vomiting.  Genitourinary:  Negative for dysuria and hematuria.  Musculoskeletal:  Negative for arthralgias and back pain.  Skin:  Negative for color change and rash.  Neurological:  Negative for seizures and syncope.  All other systems reviewed and are negative.   Physical Exam Updated Vital Signs BP 107/71 (BP Location: Left Arm)   Pulse (!) 53   Temp 97.9 F (36.6 C) (Oral)   Resp 17   Ht 5\' 10"  (1.778 m)   Wt 63.5 kg   SpO2 100%   BMI 20.09 kg/m  Physical Exam Vitals and nursing note reviewed.  Constitutional:      General: He is not in acute distress.    Appearance: He is well-developed.  HENT:     Head: Normocephalic and atraumatic.     Mouth/Throat:     Comments: Diffuse poor dentition. Eyes:     Conjunctiva/sclera: Conjunctivae normal.  Cardiovascular:     Rate and Rhythm: Normal rate and regular rhythm.     Heart sounds: No murmur heard. Pulmonary:     Effort: Pulmonary effort is normal. No respiratory distress.     Breath sounds: Normal breath  sounds.  Abdominal:     Palpations: Abdomen is soft.     Tenderness: There is no abdominal tenderness.  Musculoskeletal:        General: No swelling.     Cervical back: Neck supple.  Skin:    General: Skin is warm and dry.     Capillary Refill: Capillary refill takes less than 2 seconds.  Neurological:     Mental Status: He is alert.  Psychiatric:        Mood and Affect: Mood normal.      ED Course/ Medical Decision Making/ A&P    Procedures Procedures   Medications Ordered in ED Medications  ketorolac (TORADOL) 15 MG/ML injection 15 mg (has no administration in time range)  acetaminophen (TYLENOL) tablet 1,000 mg (has no administration in time range)  penicillin v potassium (VEETID) tablet 500 mg (has no administration in time range)   Medical Decision Making:   Gus Dicks is a 37 y.o. male who presented to the ED today with dental pain detailed above.    Complete initial physical exam performed, notably the patient was HDS in no acute distress. No obvious intraoral lesions or swelling. Poor dentition diffusely    Reviewed and confirmed nursing documentation for past medical history, family history, social history.    Initial Assessment:   With the patient's presentation of dental  pain, most likely diagnosis is reversible vs irreversible pulpitis. Other diagnoses were considered including (but not limited to) ludwig's angina, osteitis, dental abscess, PTA, RPA. These are considered less likely due to history of present illness and physical exam findings.   This is most consistent with an acute complicated illness  Initial Plan:  Symptomatic management with NSAIDS/Tylenol Due to clinical overlap with potentially reversible pulpitis, antibiotics prescribed Patient will need definitive management with dentistry. Provided low cost/local dental resource chart provided by social work.  Disposition:  I have considered need for hospitalization, however, considering all of the  above, I believe this patient is stable for discharge at this time.  Patient/family educated about specific return precautions for given chief complaint and symptoms.  Patient/family educated about follow-up with PCP and dentistry.    Patient/family expressed understanding of return precautions and need for follow-up. Patient spoken to regarding all imaging and laboratory results and appropriate follow up for these results. All education provided in verbal form with additional information in written form. Time was allowed for answering of patient questions. Patient discharged.       Clinical Impression:  1. Pain, dental      Discharge   Final Clinical Impression(s) / ED Diagnoses Final diagnoses:  Pain, dental    Rx / DC Orders ED Discharge Orders          Ordered    penicillin v potassium (VEETID) 500 MG tablet  4 times daily        10/13/22 2304              Glyn Ade, MD 10/13/22 2305

## 2022-10-13 NOTE — ED Triage Notes (Signed)
Pt reports dental pain to tooth on the back bottom right onset 2 days ago.

## 2022-11-14 ENCOUNTER — Encounter (HOSPITAL_BASED_OUTPATIENT_CLINIC_OR_DEPARTMENT_OTHER): Payer: Self-pay

## 2022-11-14 ENCOUNTER — Emergency Department (HOSPITAL_BASED_OUTPATIENT_CLINIC_OR_DEPARTMENT_OTHER)
Admission: EM | Admit: 2022-11-14 | Discharge: 2022-11-14 | Disposition: A | Discharge status: Home / Self Care | Payer: Medicaid Other | Attending: Emergency Medicine | Admitting: Emergency Medicine

## 2022-11-14 DIAGNOSIS — K0889 Other specified disorders of teeth and supporting structures: Secondary | ICD-10-CM | POA: Insufficient documentation

## 2022-11-14 NOTE — ED Provider Notes (Signed)
[place] EMERGENCY DEPARTMENT AT [facility] Provider Note   CSN: [id number] Arrival date & time: 11/14/22  4098     History  Chief Complaint  Patient presents with   Dental Pain    Jeffrey Monroe is a 37 y.o. male.   Dental Pain 37 year old male presenting for right lower dental pain.  Patient states he has had ongoing dental pain intermittently for weeks.  It worsened last night on his right lower molar.  He has not had any drainage or fever or swelling.  No vomiting.  No difficulty opening his mouth or swallowing.  He has a dental appointment August 7.  Has been taking Tylenol but cannot take NSAIDs due to recent gastric ulcer.     Home Medications Prior to Admission medications   Medication Sig Start Date End Date Taking? Authorizing Provider  ondansetron (ZOFRAN ODT) 4 MG disintegrating tablet Take 1 tablet (4 mg total) by mouth every 8 (eight) hours as needed for nausea or vomiting. 01/13/21   Tanda Rockers A, DO  sucralfate (CARAFATE) 1 g tablet Take 1 tablet (1 g total) by mouth 4 (four) times daily -  with meals and at bedtime. Take omeprazole every morning at least 30 minutes before first dose of Carafate. 09/08/19   Molpus, John, MD  sucralfate (CARAFATE) 1 g tablet Take 1 tablet (1 g total) by mouth 4 (four) times daily -  with meals and at bedtime for 7 days. 01/13/21 01/22/21  Sloan Leiter, DO  pantoprazole (PROTONIX) 40 MG tablet Take 1 tablet (40 mg total) by mouth daily. 03/31/17 09/08/19  Long, Arlyss Repress, MD      Allergies    Patient has no known allergies.    Review of Systems   Review of Systems Review of systems completed and notable as per HPI.  ROS otherwise negative.   Physical Exam Updated Vital Signs BP (!) 126/90 (BP Location: Right Arm)   Pulse 64   Temp 98.3 F (36.8 C) (Oral)   Resp 18   Ht 5\' 10"  (1.778 m)   Wt 60.3 kg   SpO2 100%   BMI 19.08 kg/m  Physical Exam Vitals and nursing note reviewed.  Constitutional:      General:  He is not in acute distress.    Appearance: He is well-developed.  HENT:     Head: Normocephalic and atraumatic.     Mouth/Throat:     Mouth: Mucous membranes are moist.     Pharynx: Oropharynx is clear.     Comments: Poor dentition.  No erythema, swelling, trismus.  Oropharynx is clear.  No purulence, fluctuance.  No facial swelling. Eyes:     Conjunctiva/sclera: Conjunctivae normal.  Cardiovascular:     Rate and Rhythm: Normal rate and regular rhythm.     Heart sounds: No murmur heard. Pulmonary:     Effort: Pulmonary effort is normal. No respiratory distress.     Breath sounds: Normal breath sounds.  Abdominal:     Palpations: Abdomen is soft.     Tenderness: There is no abdominal tenderness.  Musculoskeletal:        General: No swelling.     Cervical back: Neck supple.  Skin:    General: Skin is warm and dry.     Capillary Refill: Capillary refill takes less than 2 seconds.  Neurological:     Mental Status: He is alert.  Psychiatric:        Mood and Affect: Mood normal.  ED Results / Procedures / Treatments   Labs (all labs ordered are listed, but only abnormal results are displayed) Labs Reviewed - No data to display  EKG None  Radiology No results found.  Procedures Procedures    Medications Ordered in ED Medications - No data to display  ED Course/ Medical Decision Making/ A&P                             Medical Decision Making  Medical Decision Making:   Jeffrey Monroe is a 37 y.o. male who presented to the ED today with right dental pain.  Vital signs reviewed.  On exam he has generally poor dentition but no signs of infection.  Suspect could be pulpitis versus generalized dental pain.  He has no signs of Ludwig's angina, PTA, RPA.  No trismus.  I do not think there is any indication for antibiotics at this time based on exam, I did provide him with a list of dental resources and instructed him to call today to try to set up a sooner appointment.   Given strict return precautions for fever, swelling, worsening pain or any other new concerning symptoms.  Unfortunately due to recent gastric ulcer not a candidate for NSAIDs.  Patient's presentation is most consistent with acute, uncomplicated illness.           Final Clinical Impression(s) / ED Diagnoses Final diagnoses:  Pain, dental    Rx / DC Orders ED Discharge Orders     None         Laurence Spates, MD 11/14/22 [phone]

## 2022-11-14 NOTE — Discharge Instructions (Addendum)
I do not see any signs of dental infection at this time.  You need to use the dental resource guide to follow-up closely with a dentist within the next 2 to 3 days.  If you develop severe pain, facial swelling, persistent fevers or any other new concerning symptoms you should return to the ED.

## 2022-11-14 NOTE — ED Triage Notes (Addendum)
C/o right lower dental pain since last night. Has dental appt on 8/7.

## 2022-11-14 NOTE — ED Notes (Signed)
Reviewed instructions and follow up recommendations with pt. Pt states understanding
